# Patient Record
Sex: Female | Born: 1970 | Race: White | Hispanic: No | Marital: Married | Smoking: Never smoker
Health system: Southern US, Community
[De-identification: ages and names within clinical notes are randomized; demographics above are authoritative.]

## PROBLEM LIST (undated history)

## (undated) DIAGNOSIS — J45909 Unspecified asthma, uncomplicated: Secondary | ICD-10-CM

## (undated) DIAGNOSIS — F419 Anxiety disorder, unspecified: Secondary | ICD-10-CM

## (undated) DIAGNOSIS — I10 Essential (primary) hypertension: Secondary | ICD-10-CM

## (undated) DIAGNOSIS — F32A Depression, unspecified: Secondary | ICD-10-CM

## (undated) DIAGNOSIS — F329 Major depressive disorder, single episode, unspecified: Secondary | ICD-10-CM

## (undated) DIAGNOSIS — K219 Gastro-esophageal reflux disease without esophagitis: Secondary | ICD-10-CM

## (undated) DIAGNOSIS — G4733 Obstructive sleep apnea (adult) (pediatric): Secondary | ICD-10-CM

## (undated) HISTORY — PX: WISDOM TOOTH EXTRACTION: SHX21

## (undated) HISTORY — PX: UPPER GI ENDOSCOPY: SHX6162

---

## 2010-01-25 ENCOUNTER — Encounter: Admission: RE | Admit: 2010-01-25 | Discharge: 2010-01-25 | Payer: Self-pay | Admitting: Obstetrics and Gynecology

## 2010-09-15 ENCOUNTER — Encounter: Admission: RE | Admit: 2010-09-15 | Discharge: 2010-09-15 | Payer: Self-pay | Admitting: Obstetrics and Gynecology

## 2011-08-20 ENCOUNTER — Emergency Department (HOSPITAL_COMMUNITY): Payer: BC Managed Care – PPO

## 2011-08-20 ENCOUNTER — Emergency Department (HOSPITAL_COMMUNITY)
Admission: EM | Admit: 2011-08-20 | Discharge: 2011-08-20 | Disposition: A | Discharge status: Home / Self Care | Payer: BC Managed Care – PPO | Attending: Emergency Medicine | Admitting: Emergency Medicine

## 2011-08-20 DIAGNOSIS — W010XXA Fall on same level from slipping, tripping and stumbling without subsequent striking against object, initial encounter: Secondary | ICD-10-CM | POA: Insufficient documentation

## 2011-08-20 DIAGNOSIS — S52599A Other fractures of lower end of unspecified radius, initial encounter for closed fracture: Secondary | ICD-10-CM | POA: Insufficient documentation

## 2011-08-20 DIAGNOSIS — Y92009 Unspecified place in unspecified non-institutional (private) residence as the place of occurrence of the external cause: Secondary | ICD-10-CM | POA: Insufficient documentation

## 2011-11-08 HISTORY — PX: BREAST BIOPSY: SHX20

## 2012-04-20 ENCOUNTER — Other Ambulatory Visit: Payer: Self-pay | Admitting: Obstetrics and Gynecology

## 2012-04-20 DIAGNOSIS — N63 Unspecified lump in unspecified breast: Secondary | ICD-10-CM

## 2012-04-30 ENCOUNTER — Ambulatory Visit
Admission: RE | Admit: 2012-04-30 | Discharge: 2012-04-30 | Disposition: A | Discharge status: Home / Self Care | Payer: BC Managed Care – PPO | Source: Ambulatory Visit | Attending: Obstetrics and Gynecology | Admitting: Obstetrics and Gynecology

## 2012-04-30 ENCOUNTER — Other Ambulatory Visit: Payer: Self-pay | Admitting: Obstetrics and Gynecology

## 2012-04-30 DIAGNOSIS — N63 Unspecified lump in unspecified breast: Secondary | ICD-10-CM

## 2012-05-03 ENCOUNTER — Ambulatory Visit
Admission: RE | Admit: 2012-05-03 | Discharge: 2012-05-03 | Disposition: A | Discharge status: Home / Self Care | Payer: BC Managed Care – PPO | Source: Ambulatory Visit | Attending: Obstetrics and Gynecology | Admitting: Obstetrics and Gynecology

## 2012-05-03 ENCOUNTER — Other Ambulatory Visit: Payer: Self-pay | Admitting: Obstetrics and Gynecology

## 2012-05-03 DIAGNOSIS — N63 Unspecified lump in unspecified breast: Secondary | ICD-10-CM

## 2012-05-04 ENCOUNTER — Other Ambulatory Visit: Payer: BC Managed Care – PPO

## 2012-06-06 ENCOUNTER — Ambulatory Visit: Payer: BC Managed Care – PPO

## 2012-06-06 ENCOUNTER — Ambulatory Visit (INDEPENDENT_AMBULATORY_CARE_PROVIDER_SITE_OTHER): Payer: BC Managed Care – PPO | Admitting: Family Medicine

## 2012-06-06 VITALS — BP 108/72 | HR 53 | Temp 98.2°F | Resp 18 | Ht 68.0 in | Wt 221.2 lb

## 2012-06-06 DIAGNOSIS — R079 Chest pain, unspecified: Secondary | ICD-10-CM

## 2012-06-06 NOTE — Progress Notes (Signed)
[facility] [address] [place] [address] [phone]  Date:  06/06/2012   Name:  Sylvia Watkins   DOB:  07-18-1971   MRN:  [id number]  PCP:  No primary provider on file.    Chief Complaint: Neck Pain, Chest Pain and Stress   History of Present Illness:  Sylvia Watkins is a 41 y.o. very pleasant female patient who presents with the following:  She has noted CP in her left chest/ under the left breast intermittently over the last several months- she has had this problem on occasion for several years. When her weight fluctuates up her symptoms seem to become more pronounced.  She has also noted an ache in her left neck when she is stressed. She can feel a prickly feeling in her chest, and a tightness in her chest when she gets upset or stressed.    She noted her symptoms yesterday in the afternoon/ evening, and again while in the waiting room.   No pain currently.     She passed out as a teen and had a negative cardiac evaluation- no cardiac evaluation since.    Sylvia Watkins does not exercise regularly, but when she does exercise she does not have CP.   Her father has WPW- no other cardiac history besides HTN in her mother.    She recently has a breast boipsy- in her left breast.  This did turn out to be benign.   She has never been a smoker. No history of HTN, no history of DM. She is trying to taper off cymbalta- has been tapering off this for the last month or so.    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    Aspirin causes stomach pain.    There is no problem list on file for this patient.   No past medical history on file.  No past surgical history on file.  History  Substance Use Topics  . Smoking status: Never Smoker   . Smokeless tobacco: Not on file  . Alcohol Use: Not on file    No family history on file.  No Known Allergies  Medication list has been reviewed and updated.  No current outpatient prescriptions on file  prior to visit.    Review of Systems:  As per HPI- otherwise negative.   Physical Examination: Filed Vitals:   06/06/12 1228  BP: 108/72  Pulse: 53  Temp: 98.2 F (36.8 C)  Resp: 18   Filed Vitals:   06/06/12 1228  Height: 5\' 8"  (1.727 m)  Weight: 221 lb 3.2 oz (100.336 kg)   Body mass index is 33.63 kg/(m^2). Ideal Body Weight: Weight in (lb) to have BMI = 25: 164.1   GEN: WDWN, NAD, Non-toxic, A & O x 3, overweight HEENT: Atraumatic, Normocephalic. Neck supple. No masses, No LAD.  PEERL, EOMI, oropharynx wnl.   Ears and Nose: No external deformity. CV: RRR, No M/G/R. No JVD. No thrill. No extra heart sounds.  Chest wall is benign, no redness or bruise, unable to reproduce her CP by pressing on her chest PULM: CTA B, no wheezes, crackles, rhonchi. No retractions. No resp. distress. No accessory muscle use. ABD: S, NT, ND, +BS. No rebound. No HSM. EXTR: No c/c/e NEURO Normal gait.  PSYCH: Normally interactive. Conversant. Tearful at times.   EKG:  NSR, no ST elevation or depression  [facility] reading (PRIMARY) by  Dr. Patsy Lager.  Normal CXR CHEST - 2 VIEW  Comparison: None.  Findings: The heart size is normal. The lungs are clear. The visualized soft tissues and bony thorax are unremarkable.  IMPRESSION: Negative chest.  Assessment and Plan: 1. Chest pain  EKG 12-Lead, DG Chest 2 View, Ambulatory referral to Cardiology   Sylvia Watkins is having atypical CP- this has gone on intermittently for years, but has been worse for the last couple of months. Is likely related to anxiety, and may be partially due to her current tapering of cymbalta. Let her know that I cannot completely rule- out CAD here in the office, and offered to arrange an ED evaluation.  However, did explain that her symptoms are not typical of CAD, and that her VS and EKG are reassuring.  She elected to proceed with an ou-tpt cardiology evaluation.  Suspect that they may order a stress EKG for further evaluation.   Sylvia Watkins will seek care right away if her symptoms worsen or change in the meantime.  She will not take asa as it causes stomach upset.    Abbe Amsterdam, MD

## 2012-07-29 ENCOUNTER — Encounter: Payer: Self-pay | Admitting: *Deleted

## 2012-07-29 DIAGNOSIS — R079 Chest pain, unspecified: Secondary | ICD-10-CM

## 2012-08-02 ENCOUNTER — Encounter: Payer: Self-pay | Admitting: Family Medicine

## 2012-08-02 ENCOUNTER — Encounter: Payer: Self-pay | Admitting: *Deleted

## 2012-08-02 DIAGNOSIS — R079 Chest pain, unspecified: Secondary | ICD-10-CM

## 2012-08-20 ENCOUNTER — Encounter (HOSPITAL_COMMUNITY): Payer: Self-pay | Admitting: *Deleted

## 2012-08-21 ENCOUNTER — Encounter (HOSPITAL_COMMUNITY)
Admission: RE | Disposition: A | Discharge status: Home / Self Care | Payer: Self-pay | Source: Ambulatory Visit | Attending: Cardiology

## 2012-08-21 ENCOUNTER — Encounter (HOSPITAL_COMMUNITY): Payer: Self-pay | Admitting: *Deleted

## 2012-08-21 ENCOUNTER — Ambulatory Visit (HOSPITAL_COMMUNITY)
Admission: RE | Admit: 2012-08-21 | Discharge: 2012-08-21 | Disposition: A | Discharge status: Home / Self Care | Payer: BC Managed Care – PPO | Source: Ambulatory Visit | Attending: Cardiology | Admitting: Cardiology

## 2012-08-21 DIAGNOSIS — R079 Chest pain, unspecified: Secondary | ICD-10-CM

## 2012-08-21 DIAGNOSIS — R072 Precordial pain: Secondary | ICD-10-CM | POA: Insufficient documentation

## 2012-08-21 HISTORY — DX: Gastro-esophageal reflux disease without esophagitis: K21.9

## 2012-08-21 HISTORY — DX: Depression, unspecified: F32.A

## 2012-08-21 HISTORY — PX: TEE WITHOUT CARDIOVERSION: SHX5443

## 2012-08-21 HISTORY — DX: Anxiety disorder, unspecified: F41.9

## 2012-08-21 HISTORY — DX: Major depressive disorder, single episode, unspecified: F32.9

## 2012-08-21 HISTORY — DX: Unspecified asthma, uncomplicated: J45.909

## 2012-08-21 SURGERY — ECHOCARDIOGRAM, TRANSESOPHAGEAL
Anesthesia: Moderate Sedation

## 2012-08-21 MED ORDER — BUTAMBEN-TETRACAINE-BENZOCAINE 2-2-14 % EX AERO
INHALATION_SPRAY | CUTANEOUS | Status: DC | PRN
  Administered 2012-08-21: 2 via TOPICAL

## 2012-08-21 MED ORDER — FENTANYL CITRATE 0.05 MG/ML IJ SOLN
INTRAMUSCULAR | Status: DC | PRN
  Administered 2012-08-21 (×2): 25 ug via INTRAVENOUS

## 2012-08-21 MED ORDER — FENTANYL CITRATE 0.05 MG/ML IJ SOLN
INTRAMUSCULAR | Status: AC
  Filled 2012-08-21: qty 2

## 2012-08-21 MED ORDER — MIDAZOLAM HCL 10 MG/2ML IJ SOLN
INTRAMUSCULAR | Status: DC | PRN
  Administered 2012-08-21 (×2): 2 mg via INTRAVENOUS

## 2012-08-21 MED ORDER — SODIUM CHLORIDE 0.9 % IV SOLN
INTRAVENOUS | Status: DC
  Administered 2012-08-21: 500 mL via INTRAVENOUS

## 2012-08-21 MED ORDER — MIDAZOLAM HCL 5 MG/ML IJ SOLN
INTRAMUSCULAR | Status: AC
  Filled 2012-08-21: qty 2

## 2012-08-21 NOTE — Interval H&P Note (Signed)
History and Physical Interval Note:  08/21/2012 11:18 AM  Sylvia Watkins  has presented today for surgery, with the diagnosis of inter cardiac structure  The various methods of treatment have been discussed with the patient and family. After consideration of risks, benefits and other options for treatment, the patient has consented to  Procedure(s) (LRB) with comments: TRANSESOPHAGEAL ECHOCARDIOGRAM (TEE) (N/A) as a surgical intervention .  The patient's history has been reviewed, patient examined, no change in status, stable for surgery.  I have reviewed the patient's chart and labs.  Questions were answered to the patient's satisfaction.     Pamella Pert

## 2012-08-21 NOTE — H&P (Signed)
Sylvia Watkins is an 41 y.o. female.   Chief Complaint:  Chest pain HPI: Initially presented to  Me for evaluation of chest pain. OP echocardiogram done last month revealed a highly mobile mass in the LVOT and attached to the MV chordae. Now brought in for TEE to better evaluate the structure.  Past Medical History  Diagnosis Date  . Anxiety   . Depression   . GERD (gastroesophageal reflux disease)   . Asthma     History reviewed. No pertinent past surgical history.  History reviewed. No pertinent family history. Social History:  reports that she has never smoked. She has never used smokeless tobacco. She reports that she does not use illicit drugs. Her alcohol history not on file.  Allergies: No Known Allergies  Medications Prior to Admission  Medication Sig Dispense Refill  . esomeprazole (NEXIUM) 40 MG capsule Take 40 mg by mouth daily before breakfast.      . fish oil-omega-3 fatty acids 1000 MG capsule Take 2 g by mouth daily.      Marland Kitchen loratadine (CLARITIN) 10 MG tablet Take 10 mg by mouth daily.        No results found for this or any previous visit (from the past 48 hour(s)). No results found. ROS: No chest pain, dyspnea, headache, visual disturbances. Others are negative  Blood pressure 125/76, temperature 98.3 F (36.8 C), temperature source Oral, resp. rate 11, height 5\' 8"  (1.727 m), weight 95.709 kg (211 lb), last menstrual period 08/07/2012, SpO2 100.00%. General appearance: alert, cooperative and appears stated age Eyes: conjunctivae/corneas clear. PERRL, EOM's intact. Fundi benign. Neck: no adenopathy, no carotid bruit, no JVD, supple, symmetrical, trachea midline and thyroid not enlarged, symmetric, no tenderness/mass/nodules Neck: JVP - normal, carotids 2+= without bruits Resp: clear to auscultation bilaterally Chest wall: no tenderness Cardio: regular rate and rhythm, S1, S2 normal, no murmur, click, rub or gallop GI: soft, non-tender; bowel sounds normal; no  masses,  no organomegaly Extremities: extremities normal, atraumatic, no cyanosis or edema Pulses: 2+ and symmetric Skin: Skin color, texture, turgor normal. No rashes or lesions Neurologic: Alert and oriented X 3, normal strength and tone. Normal symmetric reflexes. Normal coordination and gait   Assessment/Plan 1. Abnormal echocardiogram now scheduled for TEE.  Pamella Pert, MD 08/21/2012, 11:14 AM

## 2012-08-21 NOTE — Progress Notes (Signed)
  Echocardiogram Echocardiogram Transesophageal has been performed.  Sylvia Watkins 08/21/2012, 3:35 PM

## 2012-08-21 NOTE — CV Procedure (Signed)
TEE: Under moderate sedation, TEE was performed without complications: LV: Normal. Normal EF. RV: Normal LA: Normal. Left atrial appendage: Normal without thrombus. Normal function. Inter atrial septum is intact without defect. Double contrast study negative for atrial level shunting. No late appearance of bubbles either. RA: Normal  MV: Normal Trace MR. There was a redundant structure noted attached to the posterior papillary muscle and protrudes into the LVOT during systole. No obstruction to aortic outflow. Probably redundant chordae or extra chordae as It appears to be close to posterior papillary muscle, but probably coming free from the LV posterior wall and disconnected from the MV. No MVP or MR. TV: Normal Trace TR AV: Normal. No AI or AS. PV: Normal. Trace PI.  Thoracic and ascending aorta: Normal without significant plaque or atheromatous changes.

## 2013-04-24 ENCOUNTER — Other Ambulatory Visit: Payer: Self-pay | Admitting: Obstetrics and Gynecology

## 2013-06-18 ENCOUNTER — Other Ambulatory Visit: Payer: Self-pay

## 2013-06-18 DIAGNOSIS — Z1231 Encounter for screening mammogram for malignant neoplasm of breast: Secondary | ICD-10-CM

## 2013-07-10 ENCOUNTER — Ambulatory Visit
Admission: RE | Admit: 2013-07-10 | Discharge: 2013-07-10 | Disposition: A | Discharge status: Home / Self Care | Payer: BC Managed Care – PPO | Source: Ambulatory Visit

## 2013-07-10 DIAGNOSIS — Z1231 Encounter for screening mammogram for malignant neoplasm of breast: Secondary | ICD-10-CM

## 2014-03-06 ENCOUNTER — Encounter (HOSPITAL_BASED_OUTPATIENT_CLINIC_OR_DEPARTMENT_OTHER): Payer: Self-pay | Admitting: *Deleted

## 2014-03-06 ENCOUNTER — Other Ambulatory Visit: Payer: Self-pay | Admitting: Otolaryngology

## 2014-03-06 NOTE — Progress Notes (Signed)
No labs needed

## 2014-03-10 ENCOUNTER — Ambulatory Visit (HOSPITAL_BASED_OUTPATIENT_CLINIC_OR_DEPARTMENT_OTHER): Payer: BC Managed Care – PPO | Admitting: Anesthesiology

## 2014-03-10 ENCOUNTER — Ambulatory Visit (HOSPITAL_BASED_OUTPATIENT_CLINIC_OR_DEPARTMENT_OTHER)
Admission: RE | Admit: 2014-03-10 | Discharge: 2014-03-10 | Disposition: A | Discharge status: Home / Self Care | Payer: BC Managed Care – PPO | Source: Ambulatory Visit | Attending: Otolaryngology | Admitting: Otolaryngology

## 2014-03-10 ENCOUNTER — Encounter (HOSPITAL_BASED_OUTPATIENT_CLINIC_OR_DEPARTMENT_OTHER): Payer: BC Managed Care – PPO | Admitting: Anesthesiology

## 2014-03-10 ENCOUNTER — Encounter (HOSPITAL_BASED_OUTPATIENT_CLINIC_OR_DEPARTMENT_OTHER)
Admission: RE | Disposition: A | Discharge status: Home / Self Care | Payer: Self-pay | Source: Ambulatory Visit | Attending: Otolaryngology

## 2014-03-10 ENCOUNTER — Encounter (HOSPITAL_BASED_OUTPATIENT_CLINIC_OR_DEPARTMENT_OTHER): Payer: Self-pay | Admitting: *Deleted

## 2014-03-10 DIAGNOSIS — K219 Gastro-esophageal reflux disease without esophagitis: Secondary | ICD-10-CM | POA: Insufficient documentation

## 2014-03-10 DIAGNOSIS — F3289 Other specified depressive episodes: Secondary | ICD-10-CM | POA: Insufficient documentation

## 2014-03-10 DIAGNOSIS — J45909 Unspecified asthma, uncomplicated: Secondary | ICD-10-CM | POA: Insufficient documentation

## 2014-03-10 DIAGNOSIS — F411 Generalized anxiety disorder: Secondary | ICD-10-CM | POA: Insufficient documentation

## 2014-03-10 DIAGNOSIS — J029 Acute pharyngitis, unspecified: Secondary | ICD-10-CM | POA: Insufficient documentation

## 2014-03-10 DIAGNOSIS — Z9089 Acquired absence of other organs: Secondary | ICD-10-CM

## 2014-03-10 DIAGNOSIS — J3501 Chronic tonsillitis: Secondary | ICD-10-CM | POA: Insufficient documentation

## 2014-03-10 DIAGNOSIS — F329 Major depressive disorder, single episode, unspecified: Secondary | ICD-10-CM | POA: Insufficient documentation

## 2014-03-10 HISTORY — PX: TONSILLECTOMY: SHX5217

## 2014-03-10 LAB — POCT HEMOGLOBIN-HEMACUE: Hemoglobin: 12.9 g/dL (ref 12.0–15.0)

## 2014-03-10 SURGERY — TONSILLECTOMY
Anesthesia: General | Site: Mouth

## 2014-03-10 MED ORDER — DEXAMETHASONE SODIUM PHOSPHATE 4 MG/ML IJ SOLN
INTRAMUSCULAR | Status: DC | PRN
  Administered 2014-03-10: 10 mg via INTRAVENOUS

## 2014-03-10 MED ORDER — OXYCODONE HCL 5 MG/5ML PO SOLN
5.0000 mg | Freq: Once | ORAL | Status: AC | PRN
  Administered 2014-03-10: 5 mg via ORAL

## 2014-03-10 MED ORDER — PROPOFOL 10 MG/ML IV BOLUS
INTRAVENOUS | Status: DC | PRN
  Administered 2014-03-10: 200 mg via INTRAVENOUS

## 2014-03-10 MED ORDER — LACTATED RINGERS IV SOLN
INTRAVENOUS | Status: DC
  Administered 2014-03-10: 09:00:00 via INTRAVENOUS

## 2014-03-10 MED ORDER — OXYCODONE HCL 5 MG PO TABS: 5.0000 mg | ORAL_TABLET | Freq: Once | ORAL | Status: AC | PRN

## 2014-03-10 MED ORDER — MIDAZOLAM HCL 2 MG/2ML IJ SOLN
INTRAMUSCULAR | Status: AC
Start: 2014-03-10 — End: 2014-03-10
  Filled 2014-03-10: qty 2

## 2014-03-10 MED ORDER — FENTANYL CITRATE 0.05 MG/ML IJ SOLN
INTRAMUSCULAR | Status: DC | PRN
  Administered 2014-03-10: 100 ug via INTRAVENOUS
  Administered 2014-03-10: 50 ug via INTRAVENOUS

## 2014-03-10 MED ORDER — SODIUM CHLORIDE 0.9 % IR SOLN
Status: DC | PRN
  Administered 2014-03-10: 200 mL

## 2014-03-10 MED ORDER — ONDANSETRON HCL 4 MG/2ML IJ SOLN
INTRAMUSCULAR | Status: DC | PRN
  Administered 2014-03-10: 4 mg via INTRAVENOUS

## 2014-03-10 MED ORDER — OXYMETAZOLINE HCL 0.05 % NA SOLN
NASAL | Status: DC | PRN
  Administered 2014-03-10: 1

## 2014-03-10 MED ORDER — MIDAZOLAM HCL 5 MG/5ML IJ SOLN
INTRAMUSCULAR | Status: DC | PRN
  Administered 2014-03-10: 2 mg via INTRAVENOUS

## 2014-03-10 MED ORDER — FENTANYL CITRATE 0.05 MG/ML IJ SOLN: 50.0000 ug | INTRAMUSCULAR | Status: DC | PRN

## 2014-03-10 MED ORDER — LIDOCAINE HCL (CARDIAC) 20 MG/ML IV SOLN
INTRAVENOUS | Status: DC | PRN
  Administered 2014-03-10: 100 mg via INTRAVENOUS

## 2014-03-10 MED ORDER — BACITRACIN 500 UNIT/GM EX OINT
TOPICAL_OINTMENT | CUTANEOUS | Status: DC | PRN
  Administered 2014-03-10: 1 via TOPICAL

## 2014-03-10 MED ORDER — MIDAZOLAM HCL 2 MG/2ML IJ SOLN: 1.0000 mg | INTRAMUSCULAR | Status: DC | PRN

## 2014-03-10 MED ORDER — OXYCODONE HCL 5 MG/5ML PO SOLN
ORAL | Status: AC
  Filled 2014-03-10: qty 5

## 2014-03-10 MED ORDER — OXYCODONE HCL 5 MG/5ML PO SOLN: 5.0000 mg | ORAL | Status: DC | PRN

## 2014-03-10 MED ORDER — ONDANSETRON HCL 4 MG/2ML IJ SOLN: 4.0000 mg | Freq: Four times a day (QID) | INTRAMUSCULAR | Status: DC | PRN

## 2014-03-10 MED ORDER — AMOXICILLIN 400 MG/5ML PO SUSR: 800.0000 mg | Freq: Two times a day (BID) | ORAL | Status: AC

## 2014-03-10 MED ORDER — SUCCINYLCHOLINE CHLORIDE 20 MG/ML IJ SOLN
INTRAMUSCULAR | Status: DC | PRN
  Administered 2014-03-10: 100 mg via INTRAVENOUS

## 2014-03-10 MED ORDER — FENTANYL CITRATE 0.05 MG/ML IJ SOLN
INTRAMUSCULAR | Status: AC
  Filled 2014-03-10: qty 6

## 2014-03-10 MED ORDER — HYDROMORPHONE HCL PF 1 MG/ML IJ SOLN: 0.2500 mg | INTRAMUSCULAR | Status: DC | PRN

## 2014-03-10 SURGICAL SUPPLY — 28 items
BANDAGE COBAN STERILE 2 (GAUZE/BANDAGES/DRESSINGS) IMPLANT
CANISTER SUCT 1200ML W/VALVE (MISCELLANEOUS) ×2 IMPLANT
CATH ROBINSON RED A/P 10FR (CATHETERS) IMPLANT
CATH ROBINSON RED A/P 14FR (CATHETERS) ×2 IMPLANT
COAGULATOR SUCT SWTCH 10FR 6 (ELECTROSURGICAL) ×2 IMPLANT
COVER MAYO STAND STRL (DRAPES) ×2 IMPLANT
ELECT REM PT RETURN 9FT ADLT (ELECTROSURGICAL) ×2
ELECT REM PT RETURN 9FT PED (ELECTROSURGICAL)
ELECTRODE REM PT RETRN 9FT PED (ELECTROSURGICAL) IMPLANT
ELECTRODE REM PT RTRN 9FT ADLT (ELECTROSURGICAL) ×1 IMPLANT
GLOVE BIO SURGEON STRL SZ7.5 (GLOVE) ×2 IMPLANT
GOWN STRL REUS W/ TWL LRG LVL3 (GOWN DISPOSABLE) ×2 IMPLANT
GOWN STRL REUS W/TWL LRG LVL3 (GOWN DISPOSABLE) ×2
IV NS 500ML (IV SOLUTION) ×1
IV NS 500ML BAXH (IV SOLUTION) ×1 IMPLANT
MARKER SKIN DUAL TIP RULER LAB (MISCELLANEOUS) IMPLANT
NS IRRIG 1000ML POUR BTL (IV SOLUTION) IMPLANT
SHEET MEDIUM DRAPE 40X70 STRL (DRAPES) ×2 IMPLANT
SOLUTION BUTLER CLEAR DIP (MISCELLANEOUS) ×2 IMPLANT
SPONGE GAUZE 4X4 12PLY STER LF (GAUZE/BANDAGES/DRESSINGS) ×2 IMPLANT
SPONGE TONSIL 1 RF SGL (DISPOSABLE) IMPLANT
SPONGE TONSIL 1.25 RF SGL STRG (GAUZE/BANDAGES/DRESSINGS) ×2 IMPLANT
SYR BULB 3OZ (MISCELLANEOUS) IMPLANT
TOWEL OR 17X24 6PK STRL BLUE (TOWEL DISPOSABLE) ×2 IMPLANT
TUBE CONNECTING 20X1/4 (TUBING) ×2 IMPLANT
TUBE SALEM SUMP 12R W/ARV (TUBING) IMPLANT
TUBE SALEM SUMP 16 FR W/ARV (TUBING) ×2 IMPLANT
WAND COBLATOR 70 EVAC XTRA (SURGICAL WAND) ×2 IMPLANT

## 2014-03-10 NOTE — H&P (Signed)
  H&P Update  Pt's original H&P dated 03/04/14 reviewed and placed in chart (to be scanned).  I personally examined the patient today.  No change in health. Proceed with tonsillectomy.

## 2014-03-10 NOTE — Transfer of Care (Signed)
Immediate Anesthesia Transfer of Care Note  Patient: Sylvia SavoyShannon C Watkins  Procedure(s) Performed: Procedure(s): TONSILLECTOMY (N/A)  Patient Location: PACU  Anesthesia Type:General  Level of Consciousness: awake and alert   Airway & Oxygen Therapy: Patient Spontanous Breathing and Patient connected to face mask oxygen  Post-op Assessment: Report given to PACU RN and Post -op Vital signs reviewed and stable  Post vital signs: Reviewed and stable  Complications: No apparent anesthesia complications

## 2014-03-10 NOTE — Op Note (Signed)
DATE OF PROCEDURE:  03/10/2014                              OPERATIVE REPORT  SURGEON:  Newman PiesSu Selby Slovacek, MD  PREOPERATIVE DIAGNOSES: 1. Tonsillar hypertrophy. 2. Chronic tonsillitis and pharyngitis  POSTOPERATIVE DIAGNOSES: 1. Tonsillar hypertrophy. 2. Chronic tonsillitis and pharyngitis  PROCEDURE PERFORMED:  Tonsillectomy.  ANESTHESIA:  General endotracheal tube anesthesia.  COMPLICATIONS:  None.  ESTIMATED BLOOD LOSS:  Minimal.  INDICATION FOR PROCEDURE:  Sylvia Watkins is a 43 y.o. female with a history of chronic tonsillitis/pharyngitis and halitosis.  According to the patient, she has been experiencing chronic throat discomfort with halitosis for several years. The patient continues to be symptomatic despite medical treatments. On examination, the patient was noted to have bilateral cryptic tonsils, with numerous tonsilloliths. Based on the above findings, the decision was made for the patient to undergo the tonsillectomy procedure. Likelihood of success in reducing symptoms was also discussed.  The risks, benefits, alternatives, and details of the procedure were discussed with the mother.  Questions were invited and answered.  Informed consent was obtained.  DESCRIPTION:  The patient was taken to the operating room and placed supine on the operating table.  General endotracheal tube anesthesia was administered by the anesthesiologist.  The patient was positioned and prepped and draped in a standard fashion for adenotonsillectomy.  A Crowe-Davis mouth gag was inserted into the oral cavity for exposure. 2+ cryptic tonsils were noted bilaterally.  No bifidity was noted.  Indirect mirror examination of the nasopharynx revealed no significant adenoid hypertrophy.  The right tonsil was then grasped with a straight Allis clamp and retracted medially.  It was resected free from the underlying pharyngeal constrictor muscles with the Coblator device.  The same procedure was repeated on the left side  without exception.  The surgical sites were copiously irrigated.  The mouth gag was removed.  The care of the patient was turned over to the anesthesiologist.  The patient was awakened from anesthesia without difficulty.  The patient was extubated and transferred to the recovery room in good condition.  OPERATIVE FINDINGS:  Tonsillar hypertrophy.  SPECIMEN:  Bilateral tonsils  FOLLOWUP CARE:  The patient will be discharged home once awake and alert.  She will be placed on amoxicillin 800 mg p.o. b.i.d. for 5 days, and oxycodone 5-5210ml po q 4 hours for postop pain control.   The patient will follow up in my office in approximately 2 weeks.  Darletta MollSui W Leticia Coletta 03/10/2014 10:28 AM

## 2014-03-10 NOTE — Discharge Instructions (Addendum)
[facility] °Postoperative Instructions for Tonsillectomy & Adenoidectomy (T&A) °Activity °Restrict activity at home for the first two days, resting as much as possible. Light indoor activity is best. You may usually return to school or work within a week but void strenuous activity and sports for two weeks. Sleep with your head elevated on 2-3 pillows for 3-4 days to help decrease swelling. °Diet °Due to tissue swelling and throat discomfort, you may have little desire to drink for several days. However fluids are very important to prevent dehydration. You will find that non-acidic juices, soups, popsicles, Jell-O, custard, puddings, and any soft or mashed foods taken in small quantities can be swallowed fairly easily. Try to increase your fluid and food intake as the discomfort subsides. It is recommended that a child receive 1-1/2 quarts of fluid in a 24-hour period. Adult require twice this amount.  °Discomfort °Your sore throat may be relieved by applying an ice collar to your neck and/or by taking Tylenol®. You may experience an earache, which is due to referred pain from the throat. Referred ear pain is commonly felt at night when trying to rest. ° °Bleeding                        Although rare, there is risk of having some bleeding during the first 2 weeks after having a T&A. This usually happens between days 7-10 postoperatively. If you or your child should have any bleeding, try to remain calm. We recommend sitting up quietly in a chair and gently spitting out the blood into a bowl. For adults, gargling gently with ice water may help. If the bleeding does not stop after a short time (5 minutes), is more than 1 teaspoonful, or if you become worried, please call our office at [phone] or go directly to the nearest hospital emergency room. Do not eat or drink anything prior to going to the hospital as you may need to be taken to the operating room in order to control the bleeding. °GENERAL  CONSIDERATIONS °1. Brush your teeth regularly. Avoid mouthwashes and gargles for three weeks. You may gargle gently with warm salt-water as necessary or spray with Chloraseptic®. You may make salt-water by placing 2 teaspoons of table salt into a quart of fresh water. Warm the salt-water in a microwave to a luke warm temperature.  °2. Avoid exposure to colds and upper respiratory infections if possible.  °3. If you look into a mirror or into your child's mouth, you will see white-gray patches in the back of the throat. This is normal after having a T&A and is like a scab that forms on the skin after an abrasion. It will disappear once the back of the throat heals completely. However, it may cause a noticeable odor; this too will disappear with time. Again, warm salt-water gargles may be used to help keep the throat clean and promote healing.  °4. You may notice a temporary change in voice quality, such as a higher pitched voice or a nasal sound, until healing is complete. This may last for 1-2 weeks and should resolve.  °5. Do not take or give you child any medications that we have not prescribed or recommended.  °6. Snoring may occur, especially at night, for the first week after a T&A. It is due to swelling of the soft palate and will usually resolve.  °Please call our office at [phone] if you have any questions.   ° ° ° °  Post Anesthesia Home Care Instructions ° °Activity: °Get plenty of rest for the remainder of the day. A responsible adult should stay with you for 24 hours following the procedure.  °For the next 24 hours, DO NOT: °-Drive a car °-Operate machinery °-Drink alcoholic beverages °-Take any medication unless instructed by your physician °-Make any legal decisions or sign important papers. ° °Meals: °Start with liquid foods such as gelatin or soup. Progress to regular foods as tolerated. Avoid greasy, spicy, heavy foods. If nausea and/or vomiting occur, drink only clear liquids until the nausea  and/or vomiting subsides. Call your physician if vomiting continues. ° °Special Instructions/Symptoms: °Your throat may feel dry or sore from the anesthesia or the breathing tube placed in your throat during surgery. If this causes discomfort, gargle with warm salt water. The discomfort should disappear within 24 hours. ° °

## 2014-03-10 NOTE — Anesthesia Postprocedure Evaluation (Signed)
Anesthesia Post Note  Patient: Sylvia SavoyShannon C Watkins  Procedure(s) Performed: Procedure(s) (LRB): TONSILLECTOMY (N/A)  Anesthesia type: General  Patient location: PACU  Post pain: Pain level controlled and Adequate analgesia  Post assessment: Post-op Vital signs reviewed, Patient's Cardiovascular Status Stable, Respiratory Function Stable, Patent Airway and Pain level controlled  Last Vitals:  Filed Vitals:   03/10/14 1200  BP: 125/69  Pulse: 69  Temp: 36.5 C  Resp: 16    Post vital signs: Reviewed and stable  Level of consciousness: awake, alert  and oriented  Complications: No apparent anesthesia complications

## 2014-03-10 NOTE — Anesthesia Preprocedure Evaluation (Signed)
Anesthesia Evaluation  Patient identified by MRN, date of birth, ID band Patient awake    Reviewed: Allergy & Precautions, H&P , NPO status , Patient's Chart, lab work & pertinent test results  Airway Mallampati: II  Neck ROM: full    Dental   Pulmonary asthma ,          Cardiovascular negative cardio ROS      Neuro/Psych Anxiety Depression    GI/Hepatic GERD-  ,  Endo/Other    Renal/GU      Musculoskeletal   Abdominal   Peds  Hematology   Anesthesia Other Findings   Reproductive/Obstetrics                           Anesthesia Physical Anesthesia Plan  ASA: II  Anesthesia Plan: General   Post-op Pain Management:    Induction: Intravenous  Airway Management Planned: Oral ETT  Additional Equipment:   Intra-op Plan:   Post-operative Plan: Extubation in OR  Informed Consent: I have reviewed the patients History and Physical, chart, labs and discussed the procedure including the risks, benefits and alternatives for the proposed anesthesia with the patient or authorized representative who has indicated his/her understanding and acceptance.     Plan Discussed with: CRNA, Anesthesiologist and Surgeon  Anesthesia Plan Comments:         Anesthesia Quick Evaluation

## 2014-03-10 NOTE — Anesthesia Procedure Notes (Signed)
Procedure Name: Intubation Date/Time: 03/10/2014 9:51 AM Performed by: Caren MacadamARTER, Hajime Asfaw W Pre-anesthesia Checklist: Patient identified, Emergency Drugs available, Suction available and Patient being monitored Patient Re-evaluated:Patient Re-evaluated prior to inductionOxygen Delivery Method: Circle System Utilized Preoxygenation: Pre-oxygenation with 100% oxygen Intubation Type: IV induction Ventilation: Mask ventilation without difficulty Laryngoscope Size: Miller and 2 Grade View: Grade I Tube type: Oral Tube size: 7.0 mm Number of attempts: 1 Airway Equipment and Method: stylet and oral airway Placement Confirmation: ETT inserted through vocal cords under direct vision,  positive ETCO2 and breath sounds checked- equal and bilateral Secured at: 23 cm Tube secured with: Tape Dental Injury: Teeth and Oropharynx as per pre-operative assessment

## 2014-03-12 ENCOUNTER — Encounter (HOSPITAL_BASED_OUTPATIENT_CLINIC_OR_DEPARTMENT_OTHER): Payer: Self-pay | Admitting: Otolaryngology

## 2014-04-25 ENCOUNTER — Other Ambulatory Visit: Payer: Self-pay | Admitting: Obstetrics and Gynecology

## 2014-04-28 LAB — CYTOLOGY - PAP

## 2014-06-30 ENCOUNTER — Other Ambulatory Visit: Payer: Self-pay

## 2014-06-30 DIAGNOSIS — Z1231 Encounter for screening mammogram for malignant neoplasm of breast: Secondary | ICD-10-CM

## 2014-07-15 ENCOUNTER — Ambulatory Visit
Admission: RE | Admit: 2014-07-15 | Discharge: 2014-07-15 | Disposition: A | Discharge status: Home / Self Care | Payer: BC Managed Care – PPO | Source: Ambulatory Visit

## 2014-07-15 DIAGNOSIS — Z1231 Encounter for screening mammogram for malignant neoplasm of breast: Secondary | ICD-10-CM

## 2015-05-01 ENCOUNTER — Other Ambulatory Visit: Payer: Self-pay | Admitting: Obstetrics and Gynecology

## 2015-05-04 LAB — CYTOLOGY - PAP

## 2015-08-31 ENCOUNTER — Other Ambulatory Visit: Payer: Self-pay

## 2015-08-31 DIAGNOSIS — Z1231 Encounter for screening mammogram for malignant neoplasm of breast: Secondary | ICD-10-CM

## 2015-10-05 ENCOUNTER — Ambulatory Visit
Admission: RE | Admit: 2015-10-05 | Discharge: 2015-10-05 | Disposition: A | Discharge status: Home / Self Care | Payer: BLUE CROSS/BLUE SHIELD | Source: Ambulatory Visit

## 2015-10-05 DIAGNOSIS — Z1231 Encounter for screening mammogram for malignant neoplasm of breast: Secondary | ICD-10-CM

## 2016-06-27 ENCOUNTER — Other Ambulatory Visit: Payer: Self-pay | Admitting: Obstetrics and Gynecology

## 2016-06-29 LAB — CYTOLOGY - PAP

## 2017-11-28 DIAGNOSIS — F39 Unspecified mood [affective] disorder: Secondary | ICD-10-CM | POA: Diagnosis not present

## 2017-12-26 DIAGNOSIS — J301 Allergic rhinitis due to pollen: Secondary | ICD-10-CM | POA: Diagnosis not present

## 2017-12-26 DIAGNOSIS — K219 Gastro-esophageal reflux disease without esophagitis: Secondary | ICD-10-CM | POA: Diagnosis not present

## 2017-12-26 DIAGNOSIS — F319 Bipolar disorder, unspecified: Secondary | ICD-10-CM | POA: Diagnosis not present

## 2018-03-16 DIAGNOSIS — K219 Gastro-esophageal reflux disease without esophagitis: Secondary | ICD-10-CM | POA: Diagnosis not present

## 2018-03-28 DIAGNOSIS — F419 Anxiety disorder, unspecified: Secondary | ICD-10-CM | POA: Diagnosis not present

## 2018-03-28 DIAGNOSIS — F3341 Major depressive disorder, recurrent, in partial remission: Secondary | ICD-10-CM | POA: Diagnosis not present

## 2018-03-30 ENCOUNTER — Other Ambulatory Visit: Payer: Self-pay | Admitting: Obstetrics and Gynecology

## 2018-03-30 DIAGNOSIS — H16042 Marginal corneal ulcer, left eye: Secondary | ICD-10-CM | POA: Diagnosis not present

## 2018-03-30 DIAGNOSIS — Z1231 Encounter for screening mammogram for malignant neoplasm of breast: Secondary | ICD-10-CM

## 2018-04-01 DIAGNOSIS — H16042 Marginal corneal ulcer, left eye: Secondary | ICD-10-CM | POA: Diagnosis not present

## 2018-04-03 DIAGNOSIS — H16042 Marginal corneal ulcer, left eye: Secondary | ICD-10-CM | POA: Diagnosis not present

## 2018-04-10 DIAGNOSIS — H16042 Marginal corneal ulcer, left eye: Secondary | ICD-10-CM | POA: Diagnosis not present

## 2018-04-30 ENCOUNTER — Ambulatory Visit: Payer: BLUE CROSS/BLUE SHIELD

## 2018-05-17 ENCOUNTER — Encounter: Payer: Self-pay | Admitting: Radiology

## 2018-05-17 ENCOUNTER — Ambulatory Visit
Admission: RE | Admit: 2018-05-17 | Discharge: 2018-05-17 | Disposition: A | Discharge status: Home / Self Care | Payer: BLUE CROSS/BLUE SHIELD | Source: Ambulatory Visit | Attending: Obstetrics and Gynecology | Admitting: Obstetrics and Gynecology

## 2018-05-17 DIAGNOSIS — Z1231 Encounter for screening mammogram for malignant neoplasm of breast: Secondary | ICD-10-CM | POA: Diagnosis not present

## 2018-07-02 DIAGNOSIS — L309 Dermatitis, unspecified: Secondary | ICD-10-CM | POA: Diagnosis not present

## 2018-07-02 DIAGNOSIS — L719 Rosacea, unspecified: Secondary | ICD-10-CM | POA: Diagnosis not present

## 2018-08-10 DIAGNOSIS — Z124 Encounter for screening for malignant neoplasm of cervix: Secondary | ICD-10-CM | POA: Diagnosis not present

## 2018-08-10 DIAGNOSIS — Z01419 Encounter for gynecological examination (general) (routine) without abnormal findings: Secondary | ICD-10-CM | POA: Diagnosis not present

## 2018-08-10 DIAGNOSIS — Z6841 Body Mass Index (BMI) 40.0 and over, adult: Secondary | ICD-10-CM | POA: Diagnosis not present

## 2018-08-15 DIAGNOSIS — J069 Acute upper respiratory infection, unspecified: Secondary | ICD-10-CM | POA: Diagnosis not present

## 2018-08-28 DIAGNOSIS — J020 Streptococcal pharyngitis: Secondary | ICD-10-CM | POA: Diagnosis not present

## 2018-09-10 IMAGING — MG DIGITAL SCREENING BILATERAL MAMMOGRAM WITH TOMO AND CAD
8 series · 8 of 24 positions shown · non-contrast
Comparison: Previous exam(s).

CLINICAL DATA: Screening.

EXAM:
DIGITAL SCREENING BILATERAL MAMMOGRAM WITH TOMO AND CAD

[R MLO synth-2D]
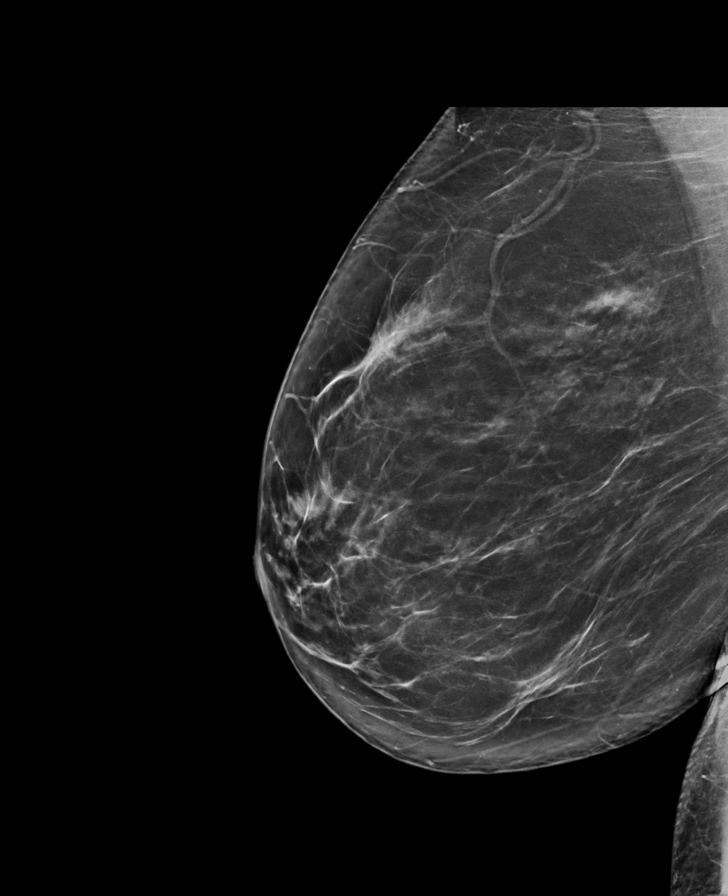

[L CC synth-2D]
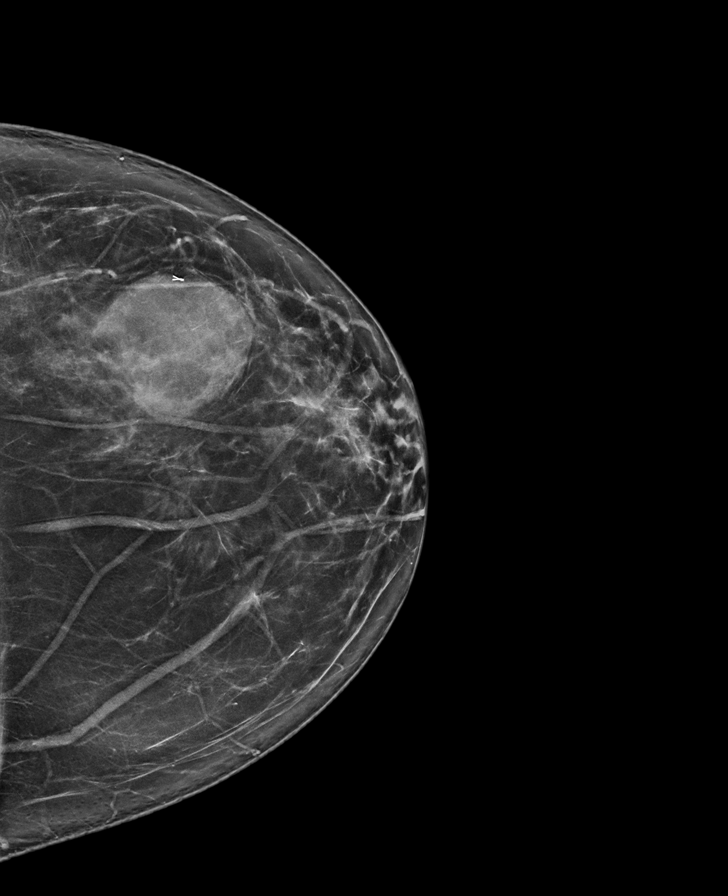

[R CC synth-2D]
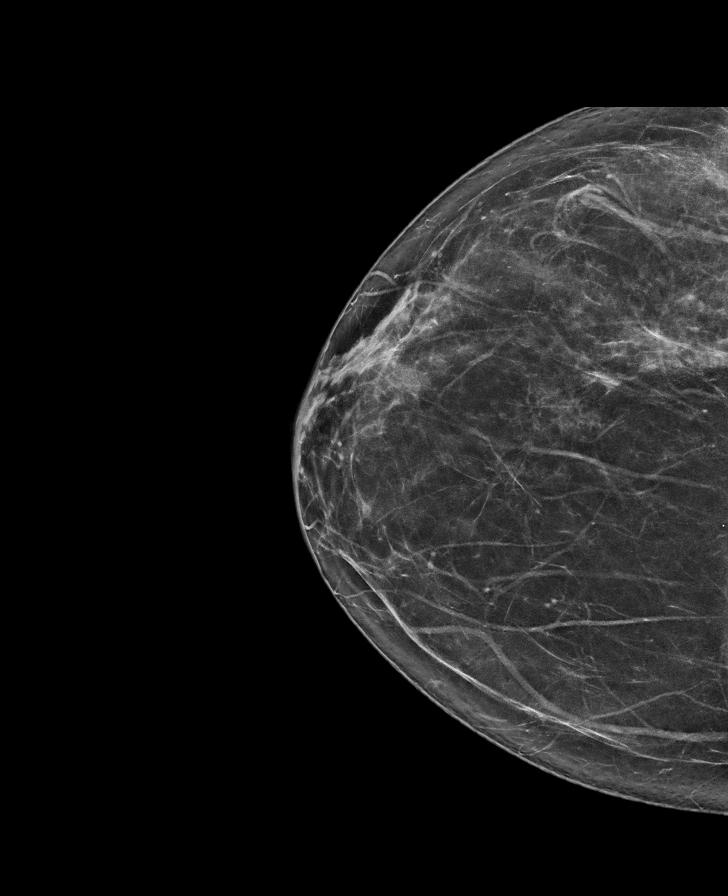

[L MLO synth-2D]
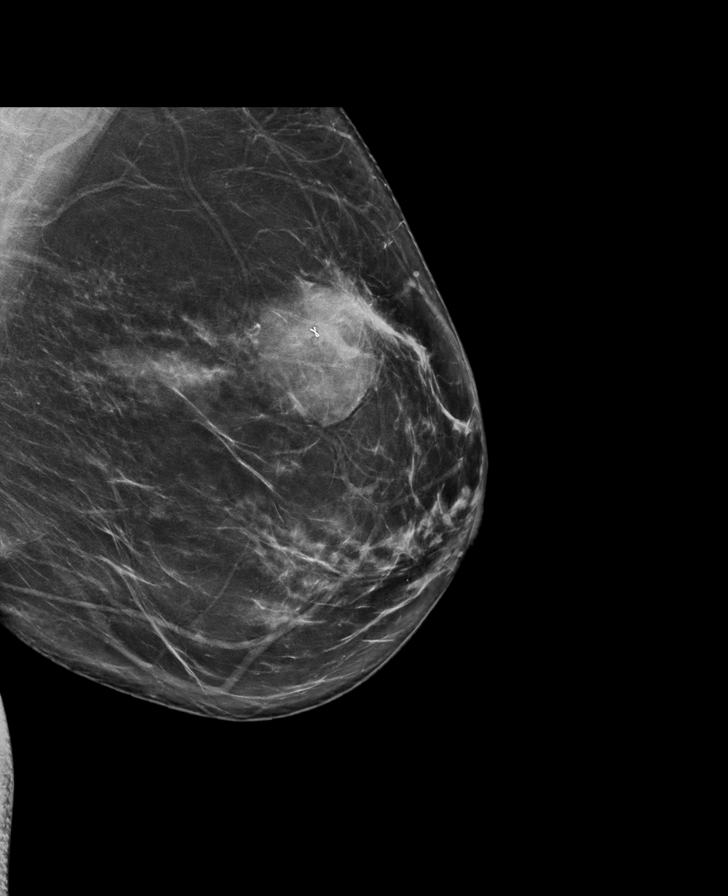

[L CC tomo · tomo slice 41/80.0]
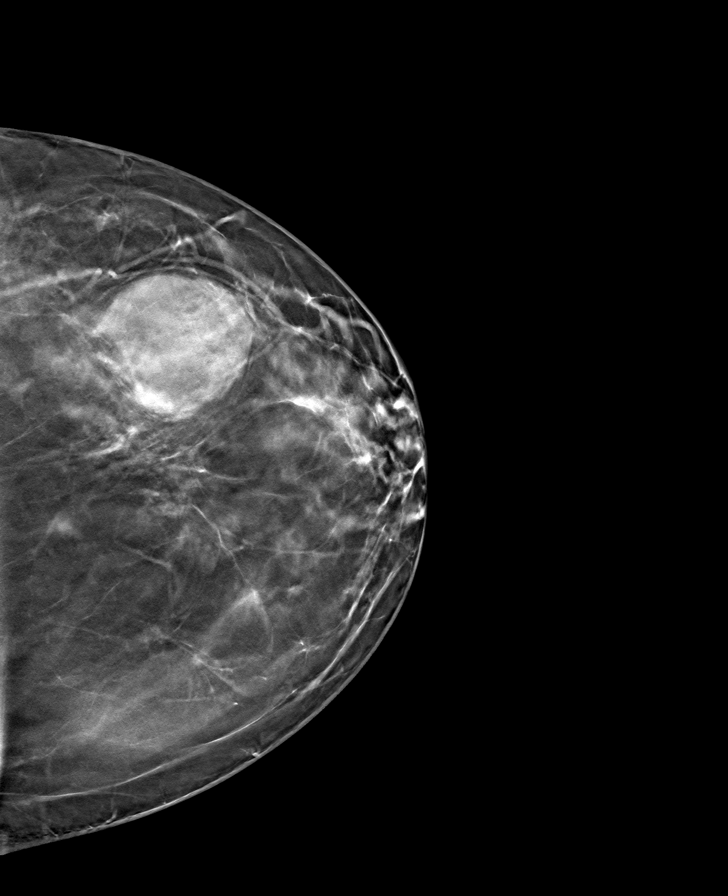

[R MLO tomo · tomo slice 43/86.0]
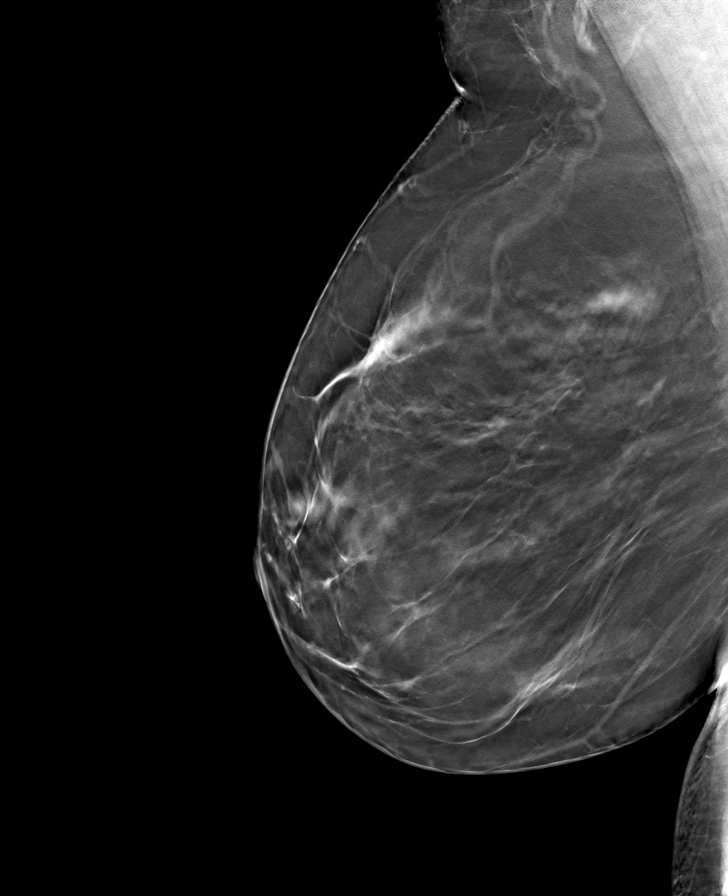

[L MLO tomo · tomo slice 45/89.0]
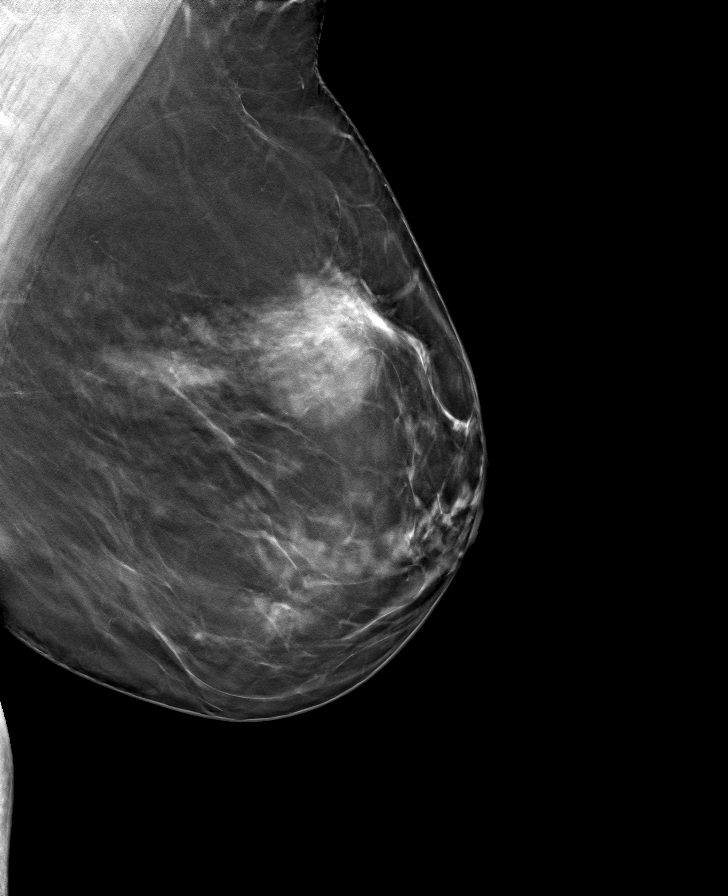

[R CC tomo · tomo slice 39/76.0]
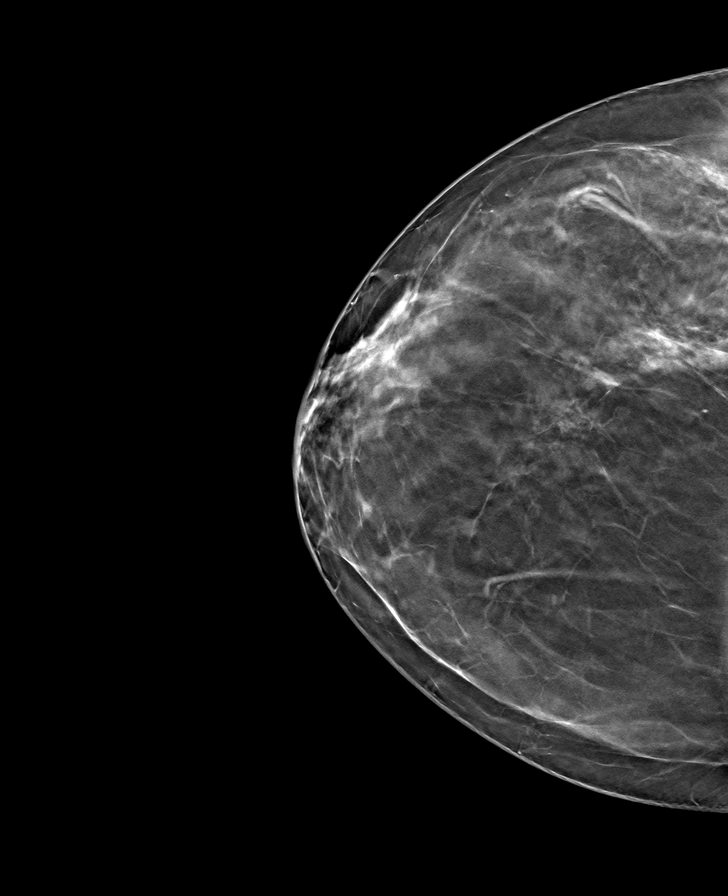

[8 of 24 positions shown; findings below may reference images not displayed]

ACR Breast Density Category b: There are scattered areas of
fibroglandular density.
FINDINGS: There are no findings suspicious for malignancy. Images were
processed with CAD.
IMPRESSION: No mammographic evidence of malignancy. A result letter of this
screening mammogram will be mailed directly to the patient.

RECOMMENDATION:
Screening mammogram in one year. (Code:CN-[id number])

BI-RADS CATEGORY  1: Negative.

## 2018-09-28 DIAGNOSIS — F419 Anxiety disorder, unspecified: Secondary | ICD-10-CM | POA: Diagnosis not present

## 2018-09-28 DIAGNOSIS — F3341 Major depressive disorder, recurrent, in partial remission: Secondary | ICD-10-CM | POA: Diagnosis not present

## 2018-09-28 DIAGNOSIS — F902 Attention-deficit hyperactivity disorder, combined type: Secondary | ICD-10-CM | POA: Diagnosis not present

## 2018-11-03 DIAGNOSIS — R631 Polydipsia: Secondary | ICD-10-CM | POA: Diagnosis not present

## 2018-11-03 DIAGNOSIS — J189 Pneumonia, unspecified organism: Secondary | ICD-10-CM | POA: Diagnosis not present

## 2018-11-03 DIAGNOSIS — R05 Cough: Secondary | ICD-10-CM | POA: Diagnosis not present

## 2018-11-08 DIAGNOSIS — J189 Pneumonia, unspecified organism: Secondary | ICD-10-CM | POA: Diagnosis not present

## 2018-11-30 ENCOUNTER — Other Ambulatory Visit: Payer: Self-pay | Admitting: Family Medicine

## 2018-11-30 ENCOUNTER — Ambulatory Visit
Admission: RE | Admit: 2018-11-30 | Discharge: 2018-11-30 | Disposition: A | Discharge status: Home / Self Care | Payer: BLUE CROSS/BLUE SHIELD | Source: Ambulatory Visit | Attending: Family Medicine | Admitting: Family Medicine

## 2018-11-30 DIAGNOSIS — R05 Cough: Secondary | ICD-10-CM | POA: Diagnosis not present

## 2018-11-30 DIAGNOSIS — J189 Pneumonia, unspecified organism: Secondary | ICD-10-CM

## 2019-03-25 DIAGNOSIS — F419 Anxiety disorder, unspecified: Secondary | ICD-10-CM | POA: Diagnosis not present

## 2019-03-25 DIAGNOSIS — F3341 Major depressive disorder, recurrent, in partial remission: Secondary | ICD-10-CM | POA: Diagnosis not present

## 2019-08-23 DIAGNOSIS — Z23 Encounter for immunization: Secondary | ICD-10-CM | POA: Diagnosis not present

## 2019-08-23 DIAGNOSIS — I10 Essential (primary) hypertension: Secondary | ICD-10-CM | POA: Diagnosis not present

## 2019-08-30 DIAGNOSIS — H16042 Marginal corneal ulcer, left eye: Secondary | ICD-10-CM | POA: Diagnosis not present

## 2019-09-12 DIAGNOSIS — Z6841 Body Mass Index (BMI) 40.0 and over, adult: Secondary | ICD-10-CM | POA: Diagnosis not present

## 2019-09-12 DIAGNOSIS — Z01419 Encounter for gynecological examination (general) (routine) without abnormal findings: Secondary | ICD-10-CM | POA: Diagnosis not present

## 2019-09-12 DIAGNOSIS — Z124 Encounter for screening for malignant neoplasm of cervix: Secondary | ICD-10-CM | POA: Diagnosis not present

## 2019-09-16 DIAGNOSIS — Z1231 Encounter for screening mammogram for malignant neoplasm of breast: Secondary | ICD-10-CM | POA: Diagnosis not present

## 2019-09-18 ENCOUNTER — Other Ambulatory Visit: Payer: Self-pay | Admitting: Obstetrics and Gynecology

## 2019-09-18 DIAGNOSIS — R928 Other abnormal and inconclusive findings on diagnostic imaging of breast: Secondary | ICD-10-CM

## 2019-09-20 ENCOUNTER — Ambulatory Visit
Admission: RE | Admit: 2019-09-20 | Discharge: 2019-09-20 | Disposition: A | Discharge status: Home / Self Care | Payer: BC Managed Care – PPO | Source: Ambulatory Visit | Attending: Obstetrics and Gynecology | Admitting: Obstetrics and Gynecology

## 2019-09-20 ENCOUNTER — Ambulatory Visit
Admission: RE | Admit: 2019-09-20 | Discharge: 2019-09-20 | Disposition: A | Discharge status: Home / Self Care | Payer: BLUE CROSS/BLUE SHIELD | Source: Ambulatory Visit | Attending: Obstetrics and Gynecology | Admitting: Obstetrics and Gynecology

## 2019-09-20 ENCOUNTER — Other Ambulatory Visit: Payer: BLUE CROSS/BLUE SHIELD

## 2019-09-20 ENCOUNTER — Other Ambulatory Visit: Payer: Self-pay

## 2019-09-20 DIAGNOSIS — N6002 Solitary cyst of left breast: Secondary | ICD-10-CM | POA: Diagnosis not present

## 2019-09-20 DIAGNOSIS — N6001 Solitary cyst of right breast: Secondary | ICD-10-CM | POA: Diagnosis not present

## 2019-09-20 DIAGNOSIS — Z13220 Encounter for screening for lipoid disorders: Secondary | ICD-10-CM | POA: Diagnosis not present

## 2019-09-20 DIAGNOSIS — Z0000 Encounter for general adult medical examination without abnormal findings: Secondary | ICD-10-CM | POA: Diagnosis not present

## 2019-09-20 DIAGNOSIS — R928 Other abnormal and inconclusive findings on diagnostic imaging of breast: Secondary | ICD-10-CM

## 2019-12-09 ENCOUNTER — Other Ambulatory Visit: Payer: BC Managed Care – PPO

## 2019-12-09 ENCOUNTER — Ambulatory Visit: Payer: BC Managed Care – PPO | Attending: Internal Medicine

## 2019-12-09 DIAGNOSIS — Z20822 Contact with and (suspected) exposure to covid-19: Secondary | ICD-10-CM | POA: Diagnosis not present

## 2019-12-10 LAB — NOVEL CORONAVIRUS, NAA: SARS-CoV-2, NAA: NOT DETECTED

## 2020-01-09 DIAGNOSIS — F419 Anxiety disorder, unspecified: Secondary | ICD-10-CM | POA: Diagnosis not present

## 2020-01-09 DIAGNOSIS — F3341 Major depressive disorder, recurrent, in partial remission: Secondary | ICD-10-CM | POA: Diagnosis not present

## 2020-02-26 DIAGNOSIS — Z79899 Other long term (current) drug therapy: Secondary | ICD-10-CM | POA: Diagnosis not present

## 2020-03-10 DIAGNOSIS — L709 Acne, unspecified: Secondary | ICD-10-CM | POA: Diagnosis not present

## 2020-03-10 DIAGNOSIS — J302 Other seasonal allergic rhinitis: Secondary | ICD-10-CM | POA: Diagnosis not present

## 2020-03-20 DIAGNOSIS — J302 Other seasonal allergic rhinitis: Secondary | ICD-10-CM | POA: Diagnosis not present

## 2020-03-20 DIAGNOSIS — I10 Essential (primary) hypertension: Secondary | ICD-10-CM | POA: Diagnosis not present

## 2020-03-20 DIAGNOSIS — K219 Gastro-esophageal reflux disease without esophagitis: Secondary | ICD-10-CM | POA: Diagnosis not present

## 2020-03-20 DIAGNOSIS — F419 Anxiety disorder, unspecified: Secondary | ICD-10-CM | POA: Diagnosis not present

## 2020-04-04 DIAGNOSIS — I10 Essential (primary) hypertension: Secondary | ICD-10-CM | POA: Diagnosis not present

## 2020-04-04 DIAGNOSIS — I80251 Phlebitis and thrombophlebitis of right calf muscular vein: Secondary | ICD-10-CM | POA: Diagnosis not present

## 2020-04-04 DIAGNOSIS — F99 Mental disorder, not otherwise specified: Secondary | ICD-10-CM | POA: Diagnosis not present

## 2020-04-04 DIAGNOSIS — E876 Hypokalemia: Secondary | ICD-10-CM | POA: Diagnosis not present

## 2020-04-04 DIAGNOSIS — M79661 Pain in right lower leg: Secondary | ICD-10-CM | POA: Diagnosis not present

## 2020-04-04 DIAGNOSIS — Z79899 Other long term (current) drug therapy: Secondary | ICD-10-CM | POA: Diagnosis not present

## 2020-04-04 DIAGNOSIS — I8002 Phlebitis and thrombophlebitis of superficial vessels of left lower extremity: Secondary | ICD-10-CM | POA: Diagnosis not present

## 2020-04-04 DIAGNOSIS — M7989 Other specified soft tissue disorders: Secondary | ICD-10-CM | POA: Diagnosis not present

## 2020-04-14 DIAGNOSIS — I8001 Phlebitis and thrombophlebitis of superficial vessels of right lower extremity: Secondary | ICD-10-CM | POA: Diagnosis not present

## 2020-04-14 DIAGNOSIS — E876 Hypokalemia: Secondary | ICD-10-CM | POA: Diagnosis not present

## 2020-05-14 DIAGNOSIS — E876 Hypokalemia: Secondary | ICD-10-CM | POA: Diagnosis not present

## 2020-05-14 DIAGNOSIS — L700 Acne vulgaris: Secondary | ICD-10-CM | POA: Diagnosis not present

## 2020-05-16 DIAGNOSIS — H11123 Conjunctival concretions, bilateral: Secondary | ICD-10-CM | POA: Diagnosis not present

## 2020-05-16 DIAGNOSIS — H02054 Trichiasis without entropian left upper eyelid: Secondary | ICD-10-CM | POA: Diagnosis not present

## 2020-06-22 DIAGNOSIS — Z20822 Contact with and (suspected) exposure to covid-19: Secondary | ICD-10-CM | POA: Diagnosis not present

## 2020-09-23 DIAGNOSIS — J019 Acute sinusitis, unspecified: Secondary | ICD-10-CM | POA: Diagnosis not present

## 2020-10-21 DIAGNOSIS — Z23 Encounter for immunization: Secondary | ICD-10-CM | POA: Diagnosis not present

## 2020-11-12 DIAGNOSIS — L237 Allergic contact dermatitis due to plants, except food: Secondary | ICD-10-CM | POA: Diagnosis not present

## 2020-11-19 DIAGNOSIS — F419 Anxiety disorder, unspecified: Secondary | ICD-10-CM | POA: Diagnosis not present

## 2020-11-19 DIAGNOSIS — L719 Rosacea, unspecified: Secondary | ICD-10-CM | POA: Diagnosis not present

## 2020-11-19 DIAGNOSIS — I10 Essential (primary) hypertension: Secondary | ICD-10-CM | POA: Diagnosis not present

## 2020-11-19 DIAGNOSIS — Z0000 Encounter for general adult medical examination without abnormal findings: Secondary | ICD-10-CM | POA: Diagnosis not present

## 2020-11-19 DIAGNOSIS — K219 Gastro-esophageal reflux disease without esophagitis: Secondary | ICD-10-CM | POA: Diagnosis not present

## 2020-11-19 DIAGNOSIS — F902 Attention-deficit hyperactivity disorder, combined type: Secondary | ICD-10-CM | POA: Diagnosis not present

## 2020-11-19 DIAGNOSIS — F3341 Major depressive disorder, recurrent, in partial remission: Secondary | ICD-10-CM | POA: Diagnosis not present

## 2020-11-19 DIAGNOSIS — Z13220 Encounter for screening for lipoid disorders: Secondary | ICD-10-CM | POA: Diagnosis not present

## 2020-11-19 DIAGNOSIS — Z79899 Other long term (current) drug therapy: Secondary | ICD-10-CM | POA: Diagnosis not present

## 2020-11-27 DIAGNOSIS — Z1231 Encounter for screening mammogram for malignant neoplasm of breast: Secondary | ICD-10-CM | POA: Diagnosis not present

## 2020-11-27 DIAGNOSIS — Z01419 Encounter for gynecological examination (general) (routine) without abnormal findings: Secondary | ICD-10-CM | POA: Diagnosis not present

## 2020-11-27 DIAGNOSIS — Z6834 Body mass index (BMI) 34.0-34.9, adult: Secondary | ICD-10-CM | POA: Diagnosis not present

## 2020-11-30 ENCOUNTER — Other Ambulatory Visit: Payer: Self-pay | Admitting: Obstetrics and Gynecology

## 2020-11-30 DIAGNOSIS — R928 Other abnormal and inconclusive findings on diagnostic imaging of breast: Secondary | ICD-10-CM

## 2020-12-04 DIAGNOSIS — D72829 Elevated white blood cell count, unspecified: Secondary | ICD-10-CM | POA: Diagnosis not present

## 2020-12-09 DIAGNOSIS — I8312 Varicose veins of left lower extremity with inflammation: Secondary | ICD-10-CM | POA: Diagnosis not present

## 2020-12-09 DIAGNOSIS — I8311 Varicose veins of right lower extremity with inflammation: Secondary | ICD-10-CM | POA: Diagnosis not present

## 2020-12-11 ENCOUNTER — Ambulatory Visit
Admission: RE | Admit: 2020-12-11 | Discharge: 2020-12-11 | Disposition: A | Discharge status: Home / Self Care | Payer: BC Managed Care – PPO | Source: Ambulatory Visit | Attending: Obstetrics and Gynecology | Admitting: Obstetrics and Gynecology

## 2020-12-11 ENCOUNTER — Other Ambulatory Visit: Payer: Self-pay

## 2020-12-11 ENCOUNTER — Ambulatory Visit: Payer: BC Managed Care – PPO

## 2020-12-11 DIAGNOSIS — R928 Other abnormal and inconclusive findings on diagnostic imaging of breast: Secondary | ICD-10-CM | POA: Diagnosis not present

## 2020-12-24 DIAGNOSIS — I8311 Varicose veins of right lower extremity with inflammation: Secondary | ICD-10-CM | POA: Diagnosis not present

## 2020-12-24 DIAGNOSIS — I8312 Varicose veins of left lower extremity with inflammation: Secondary | ICD-10-CM | POA: Diagnosis not present

## 2020-12-25 DIAGNOSIS — K219 Gastro-esophageal reflux disease without esophagitis: Secondary | ICD-10-CM | POA: Diagnosis not present

## 2020-12-25 DIAGNOSIS — Z1211 Encounter for screening for malignant neoplasm of colon: Secondary | ICD-10-CM | POA: Diagnosis not present

## 2020-12-25 DIAGNOSIS — Z800 Family history of malignant neoplasm of digestive organs: Secondary | ICD-10-CM | POA: Diagnosis not present

## 2021-01-19 DIAGNOSIS — I8311 Varicose veins of right lower extremity with inflammation: Secondary | ICD-10-CM | POA: Diagnosis not present

## 2021-01-19 DIAGNOSIS — I8312 Varicose veins of left lower extremity with inflammation: Secondary | ICD-10-CM | POA: Diagnosis not present

## 2021-02-17 DIAGNOSIS — Z800 Family history of malignant neoplasm of digestive organs: Secondary | ICD-10-CM | POA: Diagnosis not present

## 2021-02-17 DIAGNOSIS — Z1211 Encounter for screening for malignant neoplasm of colon: Secondary | ICD-10-CM | POA: Diagnosis not present

## 2021-04-06 IMAGING — MG MM DIGITAL DIAGNOSTIC UNILAT*R* W/ TOMO W/ CAD
6 series · 6 of 18 positions shown · non-contrast
Comparison: Previous exam(s).

CLINICAL DATA: Patient was recalled from screening mammogram for a
possible asymmetry in the right breast.

EXAM:
DIGITAL DIAGNOSTIC UNILATERAL RIGHT MAMMOGRAM WITH TOMOSYNTHESIS AND
CAD
TECHNIQUE: Right digital diagnostic mammography and breast tomosynthesis was
performed. Digital images of the breasts were evaluated with
computer-aided detection.

[R MLO synth-2D (1 of 2)]
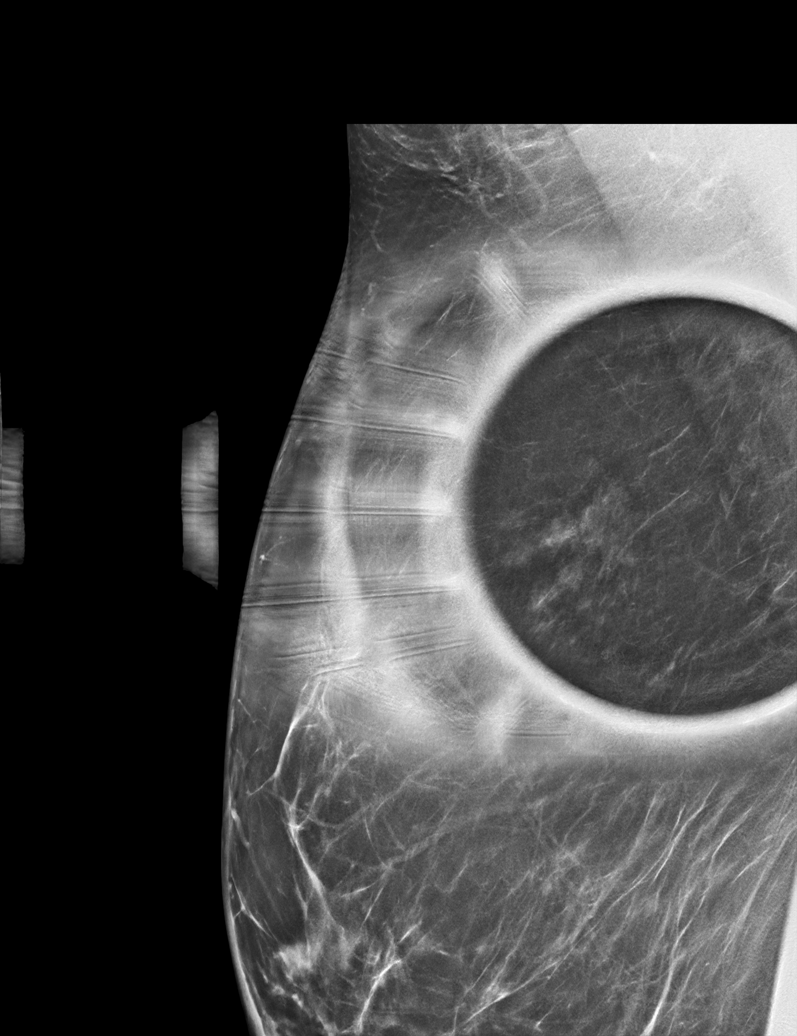

[R MLO synth-2D (2 of 2)]
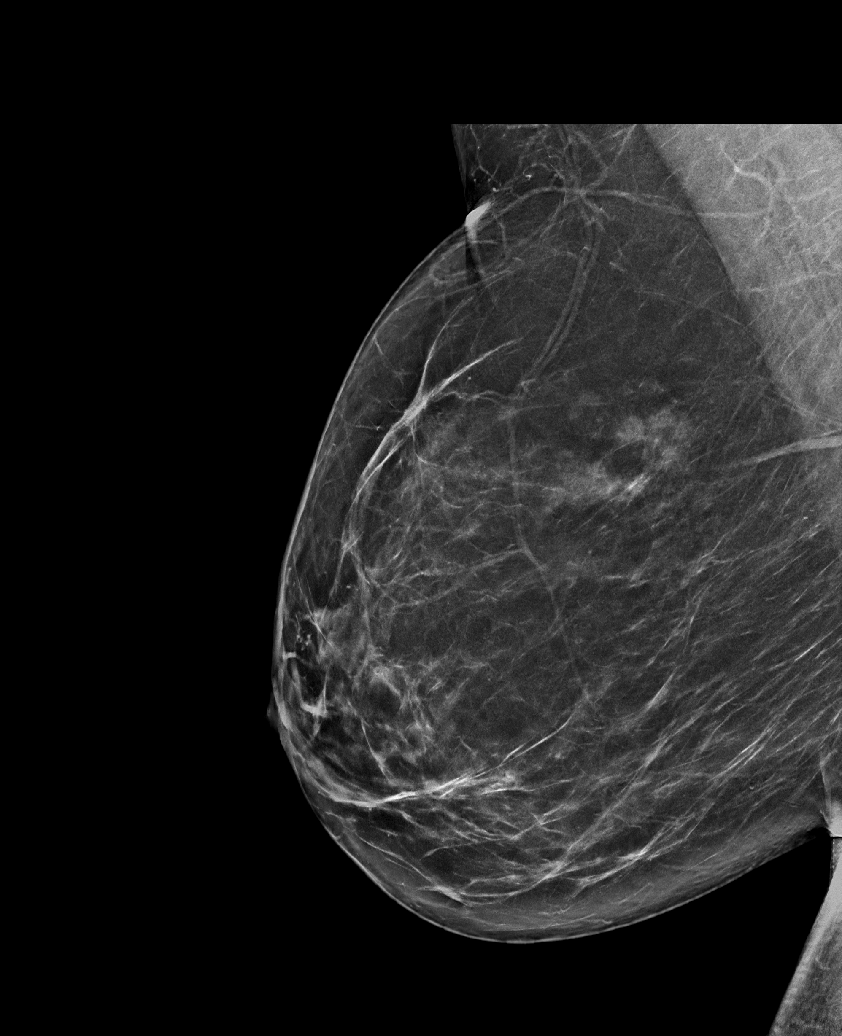

[R ML synth-2D]
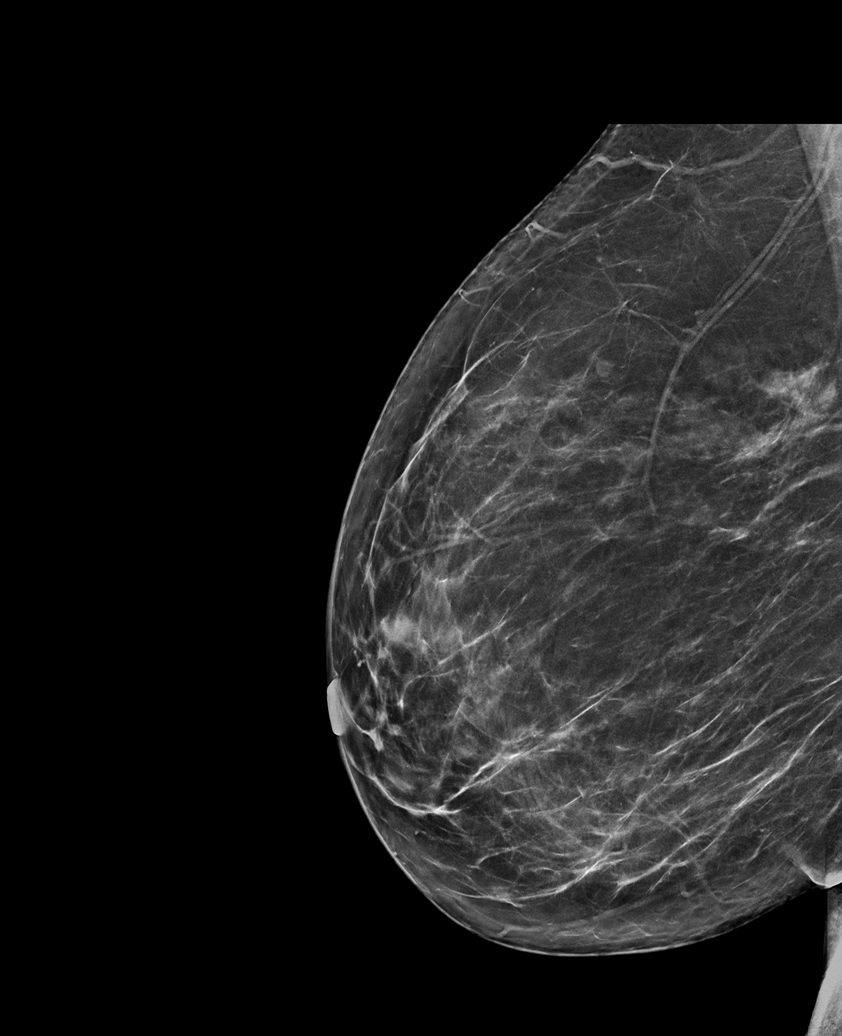

[R MLO tomo (1 of 2) · tomo slice 38/75.0]
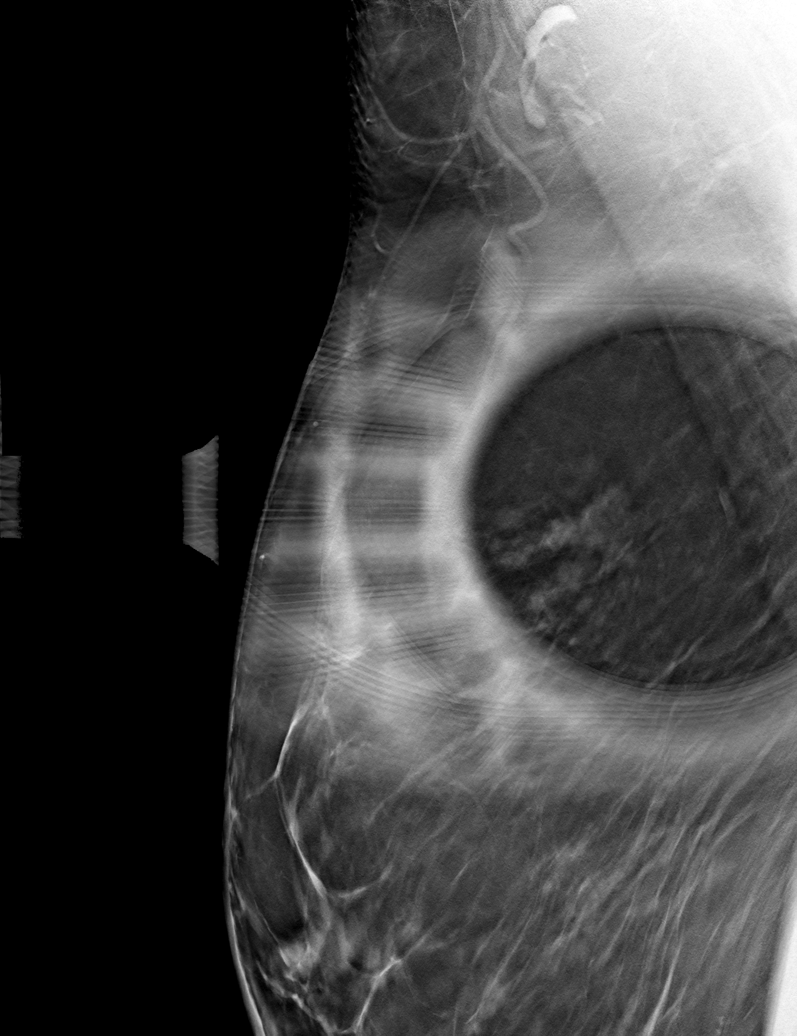

[R MLO tomo (2 of 2) · tomo slice 40/79.0]
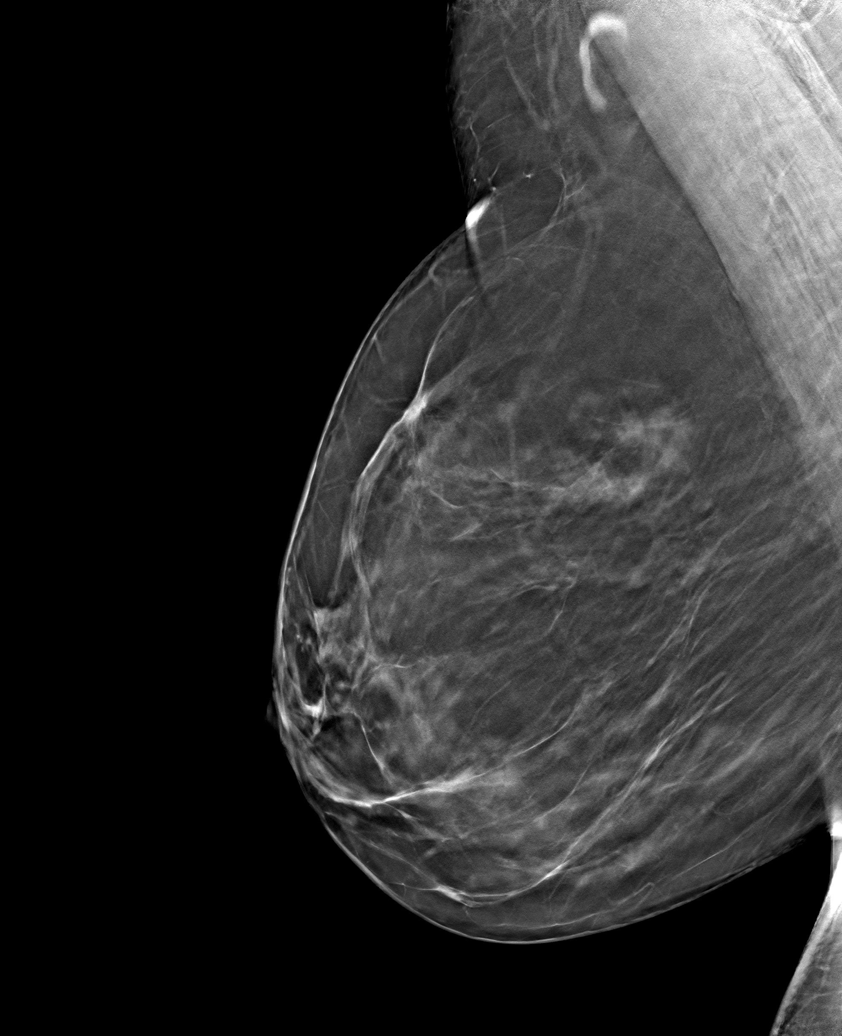

[R ML tomo · tomo slice 37/73.0]
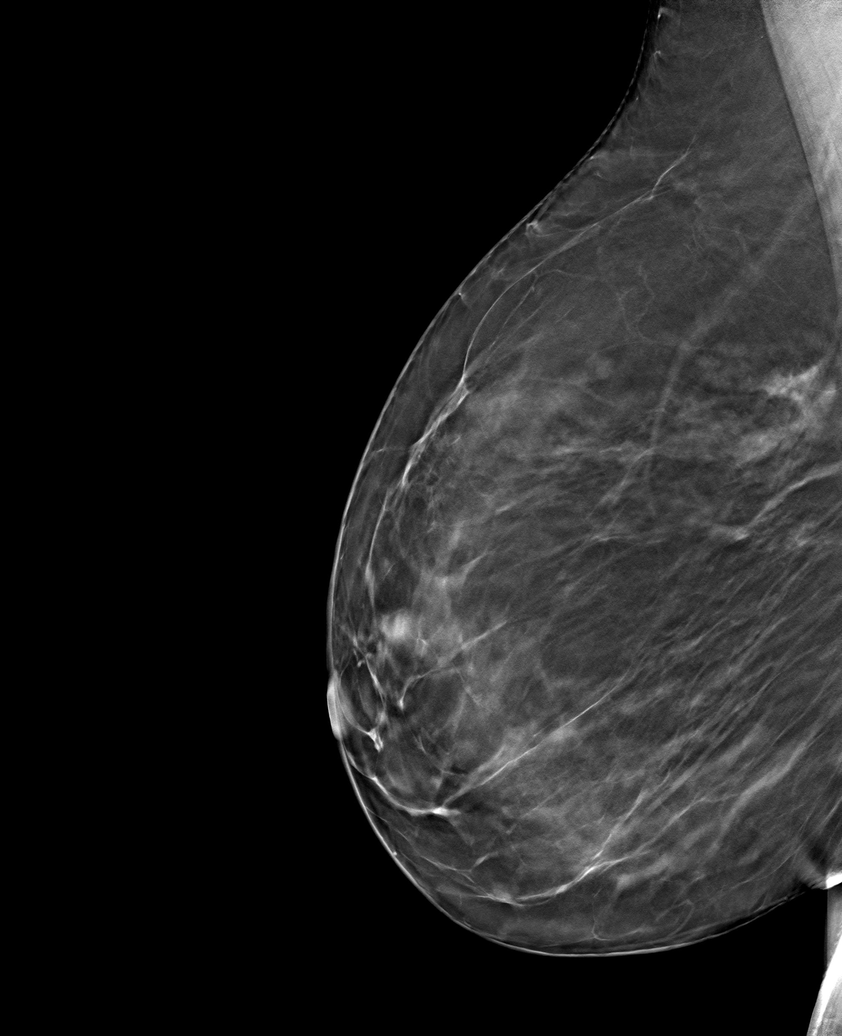

[6 of 18 positions shown; findings below may reference images not displayed]

ACR Breast Density Category b: There are scattered areas of
fibroglandular density.
FINDINGS: Additional imaging of the right breast was performed. The asymmetry
in the superior aspect of the right breast has a stable appearance
compared to prior exams dating back to 5784. No suspicious mass,
distortion or malignant type microcalcifications identified.
IMPRESSION: No evidence of malignancy in the right breast.

RECOMMENDATION:
Screening mammogram in one year.(Code:JY-5-0AC)

I have discussed the findings and recommendations with the patient.
If applicable, a reminder letter will be sent to the patient
regarding the next appointment.

BI-RADS CATEGORY  1: Negative.

## 2022-08-01 ENCOUNTER — Other Ambulatory Visit (HOSPITAL_COMMUNITY): Payer: Self-pay

## 2022-08-01 MED ORDER — OZEMPIC (1 MG/DOSE) 4 MG/3ML ~~LOC~~ SOPN
1.0000 mg | PEN_INJECTOR | SUBCUTANEOUS | 0 refills | Status: AC
  Filled 2022-08-01: qty 3, 28d supply, fill #0

## 2022-08-08 ENCOUNTER — Other Ambulatory Visit (HOSPITAL_COMMUNITY): Payer: Self-pay

## 2022-08-08 MED ORDER — WEGOVY 1.7 MG/0.75ML ~~LOC~~ SOAJ
1.7000 mg | SUBCUTANEOUS | 0 refills | Status: AC
  Filled 2022-08-08: qty 3, 28d supply, fill #0

## 2023-05-18 ENCOUNTER — Emergency Department (HOSPITAL_COMMUNITY)
Admission: EM | Admit: 2023-05-18 | Discharge: 2023-05-18 | Disposition: A | Discharge status: Home / Self Care | Payer: 59 | Attending: Emergency Medicine | Admitting: Emergency Medicine

## 2023-05-18 ENCOUNTER — Encounter (HOSPITAL_COMMUNITY): Payer: Self-pay | Admitting: Emergency Medicine

## 2023-05-18 ENCOUNTER — Other Ambulatory Visit: Payer: Self-pay

## 2023-05-18 ENCOUNTER — Emergency Department (HOSPITAL_COMMUNITY): Payer: 59

## 2023-05-18 DIAGNOSIS — J101 Influenza due to other identified influenza virus with other respiratory manifestations: Secondary | ICD-10-CM | POA: Insufficient documentation

## 2023-05-18 DIAGNOSIS — J45909 Unspecified asthma, uncomplicated: Secondary | ICD-10-CM | POA: Diagnosis not present

## 2023-05-18 DIAGNOSIS — Z1152 Encounter for screening for COVID-19: Secondary | ICD-10-CM | POA: Insufficient documentation

## 2023-05-18 DIAGNOSIS — J029 Acute pharyngitis, unspecified: Secondary | ICD-10-CM | POA: Diagnosis present

## 2023-05-18 DIAGNOSIS — J111 Influenza due to unidentified influenza virus with other respiratory manifestations: Secondary | ICD-10-CM

## 2023-05-18 LAB — COMPREHENSIVE METABOLIC PANEL
ALT: 16 U/L (ref 0–44)
AST: 21 U/L (ref 15–41)
Albumin: 3.4 g/dL — ABNORMAL LOW (ref 3.5–5.0)
Alkaline Phosphatase: 75 U/L (ref 38–126)
Anion gap: 10 (ref 5–15)
BUN: 14 mg/dL (ref 6–20)
CO2: 22 mmol/L (ref 22–32)
Calcium: 8.2 mg/dL — ABNORMAL LOW (ref 8.9–10.3)
Chloride: 103 mmol/L (ref 98–111)
Creatinine, Ser: 0.8 mg/dL (ref 0.44–1.00)
GFR, Estimated: >60 mL/min (ref >60)
Glucose, Bld: 126 mg/dL — ABNORMAL HIGH (ref 70–99)
Potassium: 3 mmol/L — ABNORMAL LOW (ref 3.5–5.1)
Sodium: 135 mmol/L (ref 135–145)
Total Bilirubin: 0.4 mg/dL (ref 0.3–1.2)
Total Protein: 6.7 g/dL (ref 6.5–8.1)

## 2023-05-18 LAB — CBC WITH DIFFERENTIAL/PLATELET
Abs Immature Granulocytes: 0.05 10*3/uL (ref 0.00–0.07)
Basophils Absolute: 0 10*3/uL (ref 0.0–0.1)
Basophils Relative: 1 %
Eosinophils Absolute: 0 10*3/uL (ref 0.0–0.5)
Eosinophils Relative: 1 %
HCT: 40.7 % (ref 36.0–46.0)
Hemoglobin: 13.6 g/dL (ref 12.0–15.0)
Immature Granulocytes: 1 %
Lymphocytes Relative: 19 %
Lymphs Abs: 0.7 10*3/uL (ref 0.7–4.0)
MCH: 29.5 pg (ref 26.0–34.0)
MCHC: 33.4 g/dL (ref 30.0–36.0)
MCV: 88.3 fL (ref 80.0–100.0)
Monocytes Absolute: 0.3 10*3/uL (ref 0.1–1.0)
Monocytes Relative: 8 %
Neutro Abs: 2.6 10*3/uL (ref 1.7–7.7)
Neutrophils Relative %: 70 %
Platelets: 261 10*3/uL (ref 150–400)
RBC: 4.61 MIL/uL (ref 3.87–5.11)
RDW: 13.9 % (ref 11.5–15.5)
WBC: 3.7 10*3/uL — ABNORMAL LOW (ref 4.0–10.5)
nRBC: 0 % (ref 0.0–0.2)

## 2023-05-18 LAB — RESP PANEL BY RT-PCR (RSV, FLU A&B, COVID)  RVPGX2
Influenza A by PCR: POSITIVE — AB
Influenza B by PCR: NEGATIVE
Resp Syncytial Virus by PCR: NEGATIVE
SARS Coronavirus 2 by RT PCR: NEGATIVE

## 2023-05-18 LAB — TECHNOLOGIST SMEAR REVIEW: Plt Morphology: NORMAL

## 2023-05-18 LAB — LACTATE DEHYDROGENASE: LDH: 176 U/L (ref 98–192)

## 2023-05-18 LAB — SEDIMENTATION RATE: Sed Rate: 18 mm/hr (ref 0–22)

## 2023-05-18 MED ORDER — ACETAMINOPHEN 500 MG PO TABS
1000.0000 mg | ORAL_TABLET | Freq: Once | ORAL | Status: AC
  Administered 2023-05-18: 1000 mg via ORAL
  Filled 2023-05-18: qty 2

## 2023-05-18 MED ORDER — LACTATED RINGERS IV BOLUS
1000.0000 mL | Freq: Once | INTRAVENOUS | Status: AC
  Administered 2023-05-18: 1000 mL via INTRAVENOUS

## 2023-05-18 NOTE — ED Notes (Signed)
IV team at the bedside. 

## 2023-05-18 NOTE — ED Notes (Signed)
Patient transported to X-ray 

## 2023-05-18 NOTE — ED Notes (Signed)
Pt states she was in [place] 3 weeks ago and bit by a mosquito. Pt was taking Malarone but symptoms hasn't subsided. Pt says she is experiencing fevers, diaphoresis, chills, aches all over, and cough that has resulted to a sore throat.   Pt says she has concerns about her pinch nerve that she has been dealing with for a month. Pt says she is u/t to sit for long periods without shooting pain down her neck and right arm. Pt says it causes her arm to swell, numbness, and tingling at times.

## 2023-05-18 NOTE — Discharge Instructions (Signed)
You are seen today and evaluated for your fever, and flulike symptoms.  We got a workup to evaluate you for malaria, dengue fever, and other viral infections.  This showed that you have the flu.  It is important to continue to take your medications against malaria, and treat your flu symptoms with plenty of fluids, and Tylenol.  You may also take ibuprofen if needed for additional pain control.  You can alternate these 2 every 3-4 hours.  Do not exceed more than 4 g of Tylenol in a 24-hour period, and only take it every 6 hours.  Your last dose of Tylenol was given at 5:30 PM today.  Please plan to follow-up with your primary care doctor early next week to repeat your labs and ensure that you do not have any liver abnormalities that might signal a coinfection with malaria.  Return to the emergency department if you have any new or worsening symptoms.

## 2023-05-18 NOTE — ED Triage Notes (Signed)
Patient arrives ambulatory by POV requesting to be checked for malaria. Patient was recently in [place] and states she was bit by a mosquito. States she was on medication before. Reports cough, fevers, chills and sweating since Monday.

## 2023-05-18 NOTE — ED Provider Notes (Signed)
[place] [facility]ENC[facility] DE[facility]MENT AT [facility] Provider Note  MDM   HPI/ROS:  Sylvia Watkins is a 52 y.o. female with a medical history as below who presents for evaluation of fever, malaise, cough, sore throat.  She reports that she was traveling in [place] and returned back to the [place] 5 days ago.  While traveling, she did notice several places that she believed to be mosquito bites.  She developed mild malaise and fatigue on the flight home, and since she has been back in the country she reports that she has had worsening malaise and a cough.  Overnight last night she had a significant fever with a Tmax of 104.  She took Tylenol, and continued to fever through the night.  This morning when she woke up she felt chilled, and had profuse diaphoresis reports that she "soaked through 2 shirts."  She is concerned about possible malaria, she was on Malarone while abroad.  The CDC called her and requested that she come to the emergency department to rule out malaria and dengue fever.  Physical exam is notable for: - Persistent cough - Erythematous oropharynx without tonsillar exudate - Clear and equal bilateral breath sounds - No evidence of rash  On my initial evaluation, patient is:  -Vital signs stable. Patient afebrile, hemodynamically stable, and non-toxic appearing. -Patient's presentation is most consistent with acute complicated illness / injury requiring diagnostic workup.. -Additional history obtained from husband, chart review  This patient's current presentation, including their history and physical exam, is most consistent with acute viral illness including possibility of both dengue fever and malaria given her travels. Differentials include COVID/flu/other viral URI, pneumonia.    Interpretations, interventions, and the patient's course of care are documented below.    Clinical Course as of 05/18/23 2228  Thu May 18, 2023  1642 Comprehensive  metabolic panel(!) No overt evidence of LFT abnormality.  [BB]  1643 CBC with Differential(!) No evidence of anemia [BB]  1643 DG Chest 2 View No evidence of pneumonia [BB]  1650 Discussed the case with the on-call infectious disease doctor who agreed with workup, suggested that the patient follow-up with her primary care early next week to evaluate for any liver abnormalities and repeat labs.  I discussed this with the patient. [BB]  1755 Influenza A By PCR(!): POSITIVE Patient positive for influenza [BB]  1800 Discussed the influenza diagnosis with the patient at bedside, and we engaged the shared decision making regarding any Tamiflu prescription.  Patient declined.  [BB]    Clinical Course User Index [BB] Fayrene Helper, MD     Disposition:  I discussed the plan for discharge with the patient and/or their surrogate at bedside prior to discharge and they were in agreement with the plan and verbalized understanding of the return precautions provided. All questions answered to the best of my ability. Ultimately, the patient was discharged in stable condition with stable vital signs. I am reassured that they are capable of close follow up and good social support at home.   Clinical Impression:  1. Influenza     Rx / DC Orders ED Discharge Orders     None       The plan for this patient was discussed with Dr. Adela Lank, who voiced agreement and who oversaw evaluation and treatment of this patient.   Clinical Complexity A medically appropriate history, review of systems, and physical exam was performed.  My independent interpretations of EKG, labs, and radiology are documented in the  ED course above.   If decision rules were used in this patient's evaluation, they are listed below.   Click here for ABCD2, HEART and other calculatorsREFRESH Note before signing   Patient's presentation is most consistent with acute complicated illness / injury requiring diagnostic  workup.  Medical Decision Making Amount and/or Complexity of Data Reviewed Labs: ordered. Decision-making details documented in ED Course. Radiology: ordered. Decision-making details documented in ED Course.  Risk OTC drugs.    HPI/ROS      See MDM section for pertinent HPI and ROS. A complete ROS was performed with pertinent positives/negatives noted above.   Past Medical History:  Diagnosis Date   Anxiety    Asthma    Depression    GERD (gastroesophageal reflux disease)     Past Surgical History:  Procedure Laterality Date   BREAST BIOPSY Left 2013   TEE WITHOUT CARDIOVERSION  08/21/2012   Procedure: TRANSESOPHAGEAL ECHOCARDIOGRAM (TEE);  Surgeon: Pamella Pert, MD;  Location: [facility] ENDOSCOPY;  Service: Cardiovascular;  Laterality: N/A;   TONSILLECTOMY N/A 03/10/2014   Procedure: TONSILLECTOMY;  Surgeon: Darletta Moll, MD;  Location: [facility];  Service: ENT;  Laterality: N/A;   UPPER GI ENDOSCOPY     WISDOM TOOTH EXTRACTION        Physical Exam   Vitals:   05/18/23 1346 05/18/23 1347 05/18/23 1816  BP: 131/85  136/88  Pulse: 79  72  Resp: 18  16  Temp: 99 F (37.2 C)  99 F (37.2 C)  TempSrc: Oral  Oral  SpO2: 99%  100%  Weight:  86.2 kg   Height:  5\' 8"  (1.727 m)     Physical Exam Vitals and nursing note reviewed.  Constitutional:      General: She is not in acute distress.    Appearance: She is well-developed.  HENT:     Head: Normocephalic and atraumatic.  Eyes:     Conjunctiva/sclera: Conjunctivae normal.  Cardiovascular:     Rate and Rhythm: Normal rate and regular rhythm.     Heart sounds: No murmur heard. Pulmonary:     Effort: Pulmonary effort is normal. No respiratory distress.     Breath sounds: Normal breath sounds.  Abdominal:     Palpations: Abdomen is soft.     Tenderness: There is no abdominal tenderness.  Musculoskeletal:        General: No swelling.     Cervical back: Neck supple.  Skin:    General: Skin is warm  and dry.     Capillary Refill: Capillary refill takes less than 2 seconds.  Neurological:     Mental Status: She is alert.  Psychiatric:        Mood and Affect: Mood normal.      Procedures   If procedures were preformed on this patient, they are listed below:  Procedures   Fayrene Helper, MD Emergency Medicine PGY-2   Please note that this documentation was produced with the assistance of voice-to-text technology and may contain errors.    Fayrene Helper, MD 05/18/23 2228    Melene Plan, DO 05/18/23 2242

## 2023-05-19 LAB — HAPTOGLOBIN: Haptoglobin: 240 mg/dL (ref 42–296)

## 2023-05-20 LAB — PLASMODIUM SP. PCR: Plasmodium Sp. PCR: NEGATIVE

## 2024-04-17 ENCOUNTER — Encounter (INDEPENDENT_AMBULATORY_CARE_PROVIDER_SITE_OTHER): Payer: Self-pay

## 2024-07-01 ENCOUNTER — Ambulatory Visit (INDEPENDENT_AMBULATORY_CARE_PROVIDER_SITE_OTHER): Admitting: Otolaryngology

## 2024-07-01 ENCOUNTER — Encounter (INDEPENDENT_AMBULATORY_CARE_PROVIDER_SITE_OTHER): Payer: Self-pay | Admitting: Otolaryngology

## 2024-07-01 VITALS — BP 150/86 | HR 69 | Ht 68.0 in | Wt 213.0 lb

## 2024-07-01 DIAGNOSIS — J342 Deviated nasal septum: Secondary | ICD-10-CM | POA: Diagnosis not present

## 2024-07-01 DIAGNOSIS — J343 Hypertrophy of nasal turbinates: Secondary | ICD-10-CM

## 2024-07-01 DIAGNOSIS — R0981 Nasal congestion: Secondary | ICD-10-CM | POA: Diagnosis not present

## 2024-07-01 DIAGNOSIS — J34829 Nasal valve collapse, unspecified: Secondary | ICD-10-CM

## 2024-07-01 DIAGNOSIS — J3089 Other allergic rhinitis: Secondary | ICD-10-CM

## 2024-07-01 DIAGNOSIS — J3489 Other specified disorders of nose and nasal sinuses: Secondary | ICD-10-CM

## 2024-07-01 DIAGNOSIS — G4733 Obstructive sleep apnea (adult) (pediatric): Secondary | ICD-10-CM

## 2024-07-01 NOTE — Patient Instructions (Addendum)
 Septoplasty: CPT 30520; CPT 30140 Rhinoplasty: CPT 30520; CPT 30140; CPT 30465; CPT 15769   Stop zepbound a week before surgery (last shot has to be at least 1 week or more prior to surgery)

## 2024-07-01 NOTE — Progress Notes (Signed)
 Otolaryngology Clinic Note  HISTORY: Sylvia Watkins is a 53 y.o. female kindly referred for evaluation of OSA and nasal obstruction  Initial visit (06/2024): She reports that she has had chronic nasal congestion, bilateral, does not think one side worse than other consistently. She has had it for my whole life -- she feels like if she inhales deeply, she has a fair amount of nasal collapse. She has tried breathe right strips and they do not help.   She does have moderate OSA, and has been unable to tolerate CPAP with several mask trials, she feels partly due to nasal obstruction. She does have an oral appliance, which helps control her snoring significantly but she continues to have significant nasal obstruction which she feels like does not help with her sleep at night. She reports that she does have typical AR symptoms with PND.   She is currently using xyzal, flonase, and astelin. She has tried nasal rinses but only once or twice. No nasal decongestant use She uses flonase and xyzal consistently.  Allergy testing has not been done. No previous sinonasal surgery.  No nasal trauma, no frequent sinus infections or CRS symptoms including discolored drainage, facial pressure/pain  GLP-1: Zebound AP/AC: no  Tobacco: no  ENT surgery: Tonsillectomy (Dr. Karis - 2015)  PMHx: HTN, GERD, MDD/OCD/ADHD  RADIOGRAPHIC EVALUATION AND INDEPENDENT REVIEW OF OTHER RECORDS:: Marinell Sayers Referral notes reviewed and uploaded or available in chart in media tab (6/6/202): noted OSA, prior seen by [facility]; started on CPAP but issue with mask leaks; noted persistent OSA - rec dental device Sleep study reviewed (2024) (AHI 15.2, Central 3.4 O2 nadir 77%) Dr. Harl Ee Sleep Notes (03/2024) reviewed: AHI 15.2, rec CPAP - failed, rec oral appliance CBC and CMP 05/18/2023: WBC 3.7, Eos 0; BUN/Cr 14/0.8 Past Medical History:  Diagnosis Date   Anxiety    Asthma    Depression    GERD (gastroesophageal  reflux disease)    Past Surgical History:  Procedure Laterality Date   BREAST BIOPSY Left 2013   TEE WITHOUT CARDIOVERSION  08/21/2012   Procedure: TRANSESOPHAGEAL ECHOCARDIOGRAM (TEE);  Surgeon: Erick JONELLE Bergamo, MD;  Location: [facility] ENDOSCOPY;  Service: Cardiovascular;  Laterality: N/A;   TONSILLECTOMY N/A 03/10/2014   Procedure: TONSILLECTOMY;  Surgeon: Ana LELON Karis, MD;  Location: [facility];  Service: ENT;  Laterality: N/A;   UPPER GI ENDOSCOPY     WISDOM TOOTH EXTRACTION     Family History  Problem Relation Age of Onset   Breast cancer Maternal Aunt        in 45's or 37's   Social History   Tobacco Use   Smoking status: Never   Smokeless tobacco: Never  Substance Use Topics   Alcohol use: Yes    Comment: occ   No Known Allergies Current Outpatient Medications  Medication Sig Dispense Refill   Azelastine HCl 137 MCG/SPRAY SOLN TAKE 2 APPLICATOR (INTRANASAL) 2 TIMES PER DAY     clobetasol ointment (TEMOVATE) 0.05 % APPLY A THIN LAYER TO THE AFFECTED AREA(S) BY TOPICAL ROUTE 2 TIMES PER DAY AS DIRECTED     doxycycline (PERIOSTAT) 20 MG tablet Take 20 mg by mouth daily.     esomeprazole (NEXIUM) 40 MG capsule Take 40 mg by mouth daily before breakfast.     estradiol (CLIMARA - DOSED IN MG/24 HR) 0.0375 mg/24hr patch APPLY 1 PATCH BY TRANSDERMAL ROUTE EVERY WEEK     fluticasone (FLONASE) 50 MCG/ACT nasal spray  hydrochlorothiazide (HYDRODIURIL) 12.5 MG tablet Take 12.5 mg by mouth daily.     HYDROcodone-acetaminophen  (NORCO/VICODIN) 5-325 MG tablet      lamoTRIgine (LAMICTAL) 100 MG tablet Take 100 mg by mouth daily.     levonorgestrel (MIRENA, 52 MG,) 20 MCG/DAY IUD Take 1 device by intrauterine route.     loratadine (CLARITIN) 10 MG tablet Take 10 mg by mouth daily.     meloxicam (MOBIC) 15 MG tablet TAKE 1 TABLET (ORAL) DAILY (PAIN)     montelukast (SINGULAIR) 10 MG tablet Take 10 mg by mouth daily.     naltrexone (DEPADE) 50 MG tablet Take 50 mg by mouth  daily.     ondansetron  (ZOFRAN ) 4 MG tablet Take 4 mg by mouth every 6 (six) hours as needed. for nausea     oxyCODONE  (ROXICODONE ) 5 MG/5ML solution Take 5-10 mLs (5-10 mg total) by mouth every 4 (four) hours as needed for moderate pain or severe pain. 300 mL 0   propranolol (INDERAL) 20 MG tablet      Semaglutide , 1 MG/DOSE, (OZEMPIC , 1 MG/DOSE,) 4 MG/3ML SOPN Inject 1 mg into the skin once a week. 3 mL 0   Semaglutide -Weight Management (WEGOVY ) 1.7 MG/0.75ML SOAJ Inject 1.7 mg into the skin once a week. 3 mL 0   terbinafine (LAMISIL) 250 MG tablet      tirzepatide (ZEPBOUND) 5 MG/0.5ML Pen INJECT 5MG  UNDER THE SKIN ONCE A WEEK 30 DAYS     VAGIFEM 10 MCG TABS vaginal tablet INSERT 1 TABLET TWICE A WEEK BY VAGINAL ROUTE AT BEDTIME FOR 28 DAYS.     venlafaxine (EFFEXOR) 37.5 MG tablet Take 37.5 mg by mouth every morning.     No current facility-administered medications for this visit.   BP (!) 150/86 (BP Location: Right Arm, Cuff Size: Large)   Pulse 69   Ht 5' 8 (1.727 m)   Wt 213 lb (96.6 kg)   SpO2 95%   BMI 32.39 kg/m   PHYSICAL EXAM:  BP (!) 150/86 (BP Location: Right Arm, Cuff Size: Large)   Pulse 69   Ht 5' 8 (1.727 m)   Wt 213 lb (96.6 kg)   SpO2 95%   BMI 32.39 kg/m    Salient findings:  CN II-XII intact Bilateral EAC clear and TM intact with well pneumatized middle ear spaces Nose: Anterior rhinoscopy reveals septum significantly deviated left, bilateral inferior turbinate hypertrophy. ULC modestly weak, but modified cottle modest pos  Nasal endoscopy was indicated to better evaluate the nose and paranasal sinuses, given the patient's history and exam findings, and is detailed below. No lesions of oral cavity/oropharynx No obviously palpable neck masses/lymphadenopathy/thyromegaly No respiratory distress or stridor   PROCEDURE:  Prior to initiating any procedures, risks/benefits/alternatives were explained to the patient and verbal consent obtained. Diagnostic  Nasal Endoscopy Pre-procedure diagnosis: Concern for nasal congestion, nasal obstruction Post-procedure diagnosis: same Indication: See pre-procedure diagnosis and physical exam above Complications: None apparent EBL: 0 mL Anesthesia: Lidocaine  4% and topical decongestant was topically sprayed in each nasal cavity  Description of Procedure:  Patient was identified. A rigid 30 degree endoscope was utilized to evaluate the sinonasal cavities, mucosa, sinus ostia and turbinates and septum.  Overall, signs of mucosal inflammation are not noted.  Also noted are significantly large left septal spur.  No mucopurulence, polyps, or masses noted.   Right Middle meatus: clear Right SE Recess: clear Left MM: clear Left SE Recess: clear    Photodocumentation was obtained.  CPT CODE --  68768 - Mod 25   ASSESSMENT:  53 y.o. with:  1. Nasal septal deviation   2. Nasal congestion   3. Hypertrophy of both inferior nasal turbinates   4. Nasal obstruction   5. Nasal valve collapse   6. OSA (obstructive sleep apnea)   7. Perennial allergic rhinitis    Clearly has a structural cause - with multiple contributory factors: septal deviation, turbinate hypertrophy, mucosal inflammation from allergies and likely some component of NV collapse. Is on nasal sprays with persistent symptoms.   PLAN: We've discussed issues and options today.  We reviewed the nasal endoscopy images together.  The risks, benefits and alternatives were discussed and questions answered.   I discussed surgical options: septoplasty, bilateral turbinate reduction +/- nasal valve repair. We discussed risks of NV repair including bruising/swelling, widening of the nose potentially, extrusion of graft, cosmetic deformity, lack of improvement. We also discussed goals of septoplasty and turbinate reduction, and expectations for postoperative management. Will plan to leave splints in place, and removal was also discussed. We also discussed  nasal obstruction post-operatively until splints in place and pain management.  We discussed R/B/A including pain, infection, bleeding (~3% risk of operative visit for control), persistent symptoms, need for revision surgery, and other risks including damage to surrounding structures, septal perforation, and injury to skull base, anesthetic complications, among others.  We also discussed use of intranasal steroid post-operatively until healing occurs  Patient understands. She would like to proceed with septoplasty/ITR only, with specific discussion about possible continued symptoms. Regardless, provided her with both CPT codes to ask her insurance if she wishes to investigate.  F/u POD 5 Will need to stop Zepbound at least 1 week prior to surgery  See below regarding exact medications prescribed this encounter including dosages and route: No orders of the defined types were placed in this encounter.    Thank you for allowing me the opportunity to care for your patient. Please do not hesitate to contact me should you have any other questions.  Sincerely, [name] Blanch, MD Otolaryngologist (ENT), [facility] Phone: [phone] Fax: [phone]  MDM:  Level 4: [phone] Complexity/Problems addressed: mod - chronic problems Data complexity: mod - independent review of notes, labs, test - Morbidity: mod - decision for surgery  - Prescription Drug prescribed or managed: no  07/01/2024, 12:14 PM

## 2024-08-28 ENCOUNTER — Encounter (HOSPITAL_BASED_OUTPATIENT_CLINIC_OR_DEPARTMENT_OTHER): Payer: Self-pay | Admitting: Emergency Medicine

## 2024-08-29 ENCOUNTER — Other Ambulatory Visit: Payer: Self-pay

## 2024-08-29 ENCOUNTER — Encounter (HOSPITAL_BASED_OUTPATIENT_CLINIC_OR_DEPARTMENT_OTHER): Payer: Self-pay | Admitting: *Deleted

## 2024-09-02 ENCOUNTER — Encounter (HOSPITAL_BASED_OUTPATIENT_CLINIC_OR_DEPARTMENT_OTHER)
Admission: RE | Admit: 2024-09-02 | Discharge: 2024-09-02 | Disposition: A | Discharge status: Home / Self Care | Source: Ambulatory Visit | Attending: Otolaryngology | Admitting: Otolaryngology

## 2024-09-02 DIAGNOSIS — Z01818 Encounter for other preprocedural examination: Secondary | ICD-10-CM | POA: Insufficient documentation

## 2024-09-02 DIAGNOSIS — Z79899 Other long term (current) drug therapy: Secondary | ICD-10-CM | POA: Insufficient documentation

## 2024-09-02 DIAGNOSIS — Z01812 Encounter for preprocedural laboratory examination: Secondary | ICD-10-CM | POA: Diagnosis present

## 2024-09-02 DIAGNOSIS — Z01810 Encounter for preprocedural cardiovascular examination: Secondary | ICD-10-CM | POA: Diagnosis present

## 2024-09-02 LAB — BASIC METABOLIC PANEL WITH GFR
Anion gap: 10 (ref 5–15)
BUN: 9 mg/dL (ref 6–20)
CO2: 27 mmol/L (ref 22–32)
Calcium: 8.5 mg/dL — ABNORMAL LOW (ref 8.9–10.3)
Chloride: 103 mmol/L (ref 98–111)
Creatinine, Ser: 0.71 mg/dL (ref 0.44–1.00)
GFR, Estimated: >60 mL/min (ref >60)
Glucose, Bld: 80 mg/dL (ref 70–99)
Potassium: 3.9 mmol/L (ref 3.5–5.1)
Sodium: 140 mmol/L (ref 135–145)

## 2024-09-03 ENCOUNTER — Other Ambulatory Visit (HOSPITAL_COMMUNITY): Payer: Self-pay

## 2024-09-03 MED ORDER — AMPHETAMINE-DEXTROAMPHET ER 20 MG PO CP24
20.0000 mg | ORAL_CAPSULE | Freq: Every day | ORAL | 0 refills | Status: AC
  Filled 2024-09-03 (×2): qty 30, 30d supply, fill #0

## 2024-09-04 ENCOUNTER — Other Ambulatory Visit: Payer: Self-pay

## 2024-09-04 ENCOUNTER — Encounter (HOSPITAL_BASED_OUTPATIENT_CLINIC_OR_DEPARTMENT_OTHER)
Admission: RE | Disposition: A | Discharge status: Home / Self Care | Payer: Self-pay | Source: Home / Self Care | Attending: Otolaryngology

## 2024-09-04 ENCOUNTER — Ambulatory Visit (HOSPITAL_BASED_OUTPATIENT_CLINIC_OR_DEPARTMENT_OTHER): Admitting: Anesthesiology

## 2024-09-04 ENCOUNTER — Ambulatory Visit (HOSPITAL_BASED_OUTPATIENT_CLINIC_OR_DEPARTMENT_OTHER)
Admission: RE | Admit: 2024-09-04 | Discharge: 2024-09-04 | Disposition: A | Discharge status: Home / Self Care | Attending: Otolaryngology | Admitting: Otolaryngology

## 2024-09-04 ENCOUNTER — Encounter (HOSPITAL_BASED_OUTPATIENT_CLINIC_OR_DEPARTMENT_OTHER): Payer: Self-pay

## 2024-09-04 DIAGNOSIS — I10 Essential (primary) hypertension: Secondary | ICD-10-CM

## 2024-09-04 DIAGNOSIS — F419 Anxiety disorder, unspecified: Secondary | ICD-10-CM | POA: Diagnosis not present

## 2024-09-04 DIAGNOSIS — E66813 Obesity, class 3: Secondary | ICD-10-CM | POA: Insufficient documentation

## 2024-09-04 DIAGNOSIS — J342 Deviated nasal septum: Secondary | ICD-10-CM

## 2024-09-04 DIAGNOSIS — F418 Other specified anxiety disorders: Secondary | ICD-10-CM | POA: Diagnosis not present

## 2024-09-04 DIAGNOSIS — G4733 Obstructive sleep apnea (adult) (pediatric): Secondary | ICD-10-CM | POA: Insufficient documentation

## 2024-09-04 DIAGNOSIS — J3489 Other specified disorders of nose and nasal sinuses: Secondary | ICD-10-CM | POA: Insufficient documentation

## 2024-09-04 DIAGNOSIS — Z6837 Body mass index (BMI) 37.0-37.9, adult: Secondary | ICD-10-CM | POA: Insufficient documentation

## 2024-09-04 DIAGNOSIS — J343 Hypertrophy of nasal turbinates: Secondary | ICD-10-CM | POA: Insufficient documentation

## 2024-09-04 DIAGNOSIS — F32A Depression, unspecified: Secondary | ICD-10-CM | POA: Insufficient documentation

## 2024-09-04 DIAGNOSIS — G4730 Sleep apnea, unspecified: Secondary | ICD-10-CM

## 2024-09-04 DIAGNOSIS — K219 Gastro-esophageal reflux disease without esophagitis: Secondary | ICD-10-CM | POA: Diagnosis not present

## 2024-09-04 DIAGNOSIS — R0981 Nasal congestion: Secondary | ICD-10-CM

## 2024-09-04 DIAGNOSIS — Z01818 Encounter for other preprocedural examination: Secondary | ICD-10-CM

## 2024-09-04 DIAGNOSIS — Z79899 Other long term (current) drug therapy: Secondary | ICD-10-CM

## 2024-09-04 HISTORY — DX: Essential (primary) hypertension: I10

## 2024-09-04 HISTORY — PX: NASAL SEPTOPLASTY W/ TURBINOPLASTY: SHX2070

## 2024-09-04 HISTORY — DX: Obstructive sleep apnea (adult) (pediatric): G47.33

## 2024-09-04 LAB — POCT PREGNANCY, URINE: Preg Test, Ur: NEGATIVE

## 2024-09-04 SURGERY — SEPTOPLASTY, NOSE, WITH NASAL TURBINATE REDUCTION
Anesthesia: General | Laterality: Bilateral

## 2024-09-04 MED ORDER — ACETAMINOPHEN 500 MG PO TABS
1000.0000 mg | ORAL_TABLET | Freq: Once | ORAL | Status: AC
  Administered 2024-09-04: 1000 mg via ORAL

## 2024-09-04 MED ORDER — FENTANYL CITRATE (PF) 100 MCG/2ML IJ SOLN
INTRAMUSCULAR | Status: AC
  Filled 2024-09-04: qty 2

## 2024-09-04 MED ORDER — FENTANYL CITRATE (PF) 100 MCG/2ML IJ SOLN
INTRAMUSCULAR | Status: DC | PRN
  Administered 2024-09-04 (×3): 50 ug via INTRAVENOUS

## 2024-09-04 MED ORDER — LIDOCAINE HCL (CARDIAC) PF 100 MG/5ML IV SOSY
PREFILLED_SYRINGE | INTRAVENOUS | Status: DC | PRN
  Administered 2024-09-04: 100 mg via INTRAVENOUS

## 2024-09-04 MED ORDER — OXYCODONE HCL 5 MG PO TABS
5.0000 mg | ORAL_TABLET | Freq: Once | ORAL | Status: AC
Start: 2024-09-04 — End: 2024-09-04
  Administered 2024-09-04: 5 mg via ORAL

## 2024-09-04 MED ORDER — MIDAZOLAM HCL 5 MG/5ML IJ SOLN
INTRAMUSCULAR | Status: DC | PRN
  Administered 2024-09-04: 2 mg via INTRAVENOUS

## 2024-09-04 MED ORDER — PHENYLEPHRINE 80 MCG/ML (10ML) SYRINGE FOR IV PUSH (FOR BLOOD PRESSURE SUPPORT)
PREFILLED_SYRINGE | INTRAVENOUS | Status: AC
Start: 2024-09-04 — End: 2024-09-04
  Filled 2024-09-04: qty 10

## 2024-09-04 MED ORDER — SALINE SPRAY 0.65 % NA SOLN: 2.0000 | NASAL | 0 refills | Status: AC | PRN

## 2024-09-04 MED ORDER — MIDAZOLAM HCL 2 MG/2ML IJ SOLN
INTRAMUSCULAR | Status: AC
  Filled 2024-09-04: qty 2

## 2024-09-04 MED ORDER — GLYCOPYRROLATE PF 0.2 MG/ML IJ SOSY
PREFILLED_SYRINGE | INTRAMUSCULAR | Status: AC
  Filled 2024-09-04: qty 1

## 2024-09-04 MED ORDER — FLUORESCEIN SODIUM 1 MG OP STRP
ORAL_STRIP | OPHTHALMIC | Status: DC | PRN
  Administered 2024-09-04: 1

## 2024-09-04 MED ORDER — CEFAZOLIN SODIUM-DEXTROSE 2-3 GM-%(50ML) IV SOLR
INTRAVENOUS | Status: DC | PRN
  Administered 2024-09-04: 2 g via INTRAVENOUS

## 2024-09-04 MED ORDER — LIDOCAINE-EPINEPHRINE 1 %-1:100000 IJ SOLN
INTRAMUSCULAR | Status: AC
  Filled 2024-09-04: qty 1

## 2024-09-04 MED ORDER — EPINEPHRINE HCL (NASAL) 0.1 % NA SOLN
NASAL | Status: AC
  Filled 2024-09-04: qty 10

## 2024-09-04 MED ORDER — PROPOFOL 10 MG/ML IV BOLUS
INTRAVENOUS | Status: DC | PRN
  Administered 2024-09-04: 50 mg via INTRAVENOUS
  Administered 2024-09-04: 200 mg via INTRAVENOUS

## 2024-09-04 MED ORDER — FLUORESCEIN SODIUM 1 MG OP STRP
ORAL_STRIP | OPHTHALMIC | Status: AC
  Filled 2024-09-04: qty 1

## 2024-09-04 MED ORDER — PROPOFOL 500 MG/50ML IV EMUL
INTRAVENOUS | Status: DC | PRN
  Administered 2024-09-04: 100 ug/kg/min via INTRAVENOUS

## 2024-09-04 MED ORDER — ONDANSETRON HCL 4 MG/2ML IJ SOLN
INTRAMUSCULAR | Status: DC | PRN
Start: 2024-09-04 — End: 2024-09-04
  Administered 2024-09-04: 4 mg via INTRAVENOUS

## 2024-09-04 MED ORDER — HYDROMORPHONE HCL 1 MG/ML IJ SOLN
0.2500 mg | INTRAMUSCULAR | Status: DC | PRN
  Administered 2024-09-04: 0.5 mg via INTRAVENOUS

## 2024-09-04 MED ORDER — ACETAMINOPHEN 500 MG PO TABS: 1000.0000 mg | ORAL_TABLET | Freq: Four times a day (QID) | ORAL | 2 refills | Status: AC | PRN

## 2024-09-04 MED ORDER — HYDROMORPHONE HCL 1 MG/ML IJ SOLN
INTRAMUSCULAR | Status: AC
  Filled 2024-09-04: qty 0.5

## 2024-09-04 MED ORDER — CEFAZOLIN SODIUM 1 G IJ SOLR
INTRAMUSCULAR | Status: AC
Start: 2024-09-04 — End: 2024-09-04
  Filled 2024-09-04: qty 10

## 2024-09-04 MED ORDER — GLYCOPYRROLATE 0.2 MG/ML IJ SOLN
INTRAMUSCULAR | Status: DC | PRN
  Administered 2024-09-04: .2 mg via INTRAVENOUS

## 2024-09-04 MED ORDER — IBUPROFEN 200 MG PO TABS
400.0000 mg | ORAL_TABLET | Freq: Four times a day (QID) | ORAL | 2 refills | Status: AC | PRN
Start: 2024-09-04 — End: 2025-09-04

## 2024-09-04 MED ORDER — SODIUM CHLORIDE 0.9 % IV SOLN
INTRAVENOUS | Status: AC | PRN
  Administered 2024-09-04: 1000 mL

## 2024-09-04 MED ORDER — LIDOCAINE-EPINEPHRINE 1 %-1:100000 IJ SOLN
INTRAMUSCULAR | Status: DC | PRN
Start: 2024-09-04 — End: 2024-09-04
  Administered 2024-09-04: 18 mL

## 2024-09-04 MED ORDER — LACTATED RINGERS IV SOLN: INTRAVENOUS | Status: DC

## 2024-09-04 MED ORDER — ACETAMINOPHEN 500 MG PO TABS
ORAL_TABLET | ORAL | Status: AC
  Filled 2024-09-04: qty 2

## 2024-09-04 MED ORDER — EPINEPHRINE 0.1 % (1MG/ML) IJ FOR NASAL USE
INTRAMUSCULAR | Status: DC | PRN
  Administered 2024-09-04: 1 [drp] via TOPICAL

## 2024-09-04 MED ORDER — SUGAMMADEX SODIUM 200 MG/2ML IV SOLN
INTRAVENOUS | Status: DC | PRN
  Administered 2024-09-04: 200 mg via INTRAVENOUS

## 2024-09-04 MED ORDER — DEXAMETHASONE SODIUM PHOSPHATE 4 MG/ML IJ SOLN
INTRAMUSCULAR | Status: DC | PRN
  Administered 2024-09-04: 10 mg via INTRAVENOUS

## 2024-09-04 MED ORDER — ROCURONIUM BROMIDE 100 MG/10ML IV SOLN
INTRAVENOUS | Status: DC | PRN
  Administered 2024-09-04: 10 mg via INTRAVENOUS
  Administered 2024-09-04: 50 mg via INTRAVENOUS
  Administered 2024-09-04: 10 mg via INTRAVENOUS

## 2024-09-04 MED ORDER — 0.9 % SODIUM CHLORIDE (POUR BTL) OPTIME
TOPICAL | Status: DC | PRN
  Administered 2024-09-04: 1000 mL

## 2024-09-04 MED ORDER — OXYCODONE HCL 5 MG PO TABS: 5.0000 mg | ORAL_TABLET | ORAL | 0 refills | Status: AC | PRN

## 2024-09-04 MED ORDER — PROPOFOL 500 MG/50ML IV EMUL
INTRAVENOUS | Status: AC
Start: 2024-09-04 — End: 2024-09-04
  Filled 2024-09-04: qty 50

## 2024-09-04 MED ORDER — PHENYLEPHRINE HCL (PRESSORS) 10 MG/ML IV SOLN
INTRAVENOUS | Status: DC | PRN
  Administered 2024-09-04 (×2): 80 ug via INTRAVENOUS

## 2024-09-04 MED ORDER — OXYCODONE HCL 5 MG PO TABS
ORAL_TABLET | ORAL | Status: AC
  Filled 2024-09-04: qty 1

## 2024-09-04 MED ORDER — EPINEPHRINE 0.1 % (1MG/ML) IJ FOR NASAL USE: INTRAMUSCULAR | Status: DC | PRN

## 2024-09-04 MED ORDER — PROPOFOL 10 MG/ML IV BOLUS
INTRAVENOUS | Status: AC
  Filled 2024-09-04: qty 20

## 2024-09-04 MED ORDER — CEFAZOLIN SODIUM 1 G IJ SOLR
INTRAMUSCULAR | Status: AC
  Filled 2024-09-04: qty 10

## 2024-09-04 MED ORDER — PHENYLEPHRINE 80 MCG/ML (10ML) SYRINGE FOR IV PUSH (FOR BLOOD PRESSURE SUPPORT)
PREFILLED_SYRINGE | INTRAVENOUS | Status: AC
  Filled 2024-09-04: qty 10

## 2024-09-04 MED ORDER — MUPIROCIN 2 % EX OINT
TOPICAL_OINTMENT | CUTANEOUS | Status: AC
  Filled 2024-09-04: qty 22

## 2024-09-04 SURGICAL SUPPLY — 40 items
BLADE INF TURB ROT M4 2 5PK (BLADE) IMPLANT
BLADE SHAVER TURBINATE 11X2.9 (BLADE) ×1 IMPLANT
BLADE TRICUT ROTATE M4 4 5PK (BLADE) IMPLANT
CANISTER SUCT 1200ML W/VALVE (MISCELLANEOUS) ×2 IMPLANT
COAGULATOR SUCT 8FR VV (MISCELLANEOUS) IMPLANT
DEFOGGER MIRROR 1QT (MISCELLANEOUS) ×1 IMPLANT
DRSG NASOPORE 8CM (GAUZE/BANDAGES/DRESSINGS) IMPLANT
DRSG TELFA 3X8 NADH STRL (GAUZE/BANDAGES/DRESSINGS) IMPLANT
ELECTRODE REM PT RTRN 9FT ADLT (ELECTROSURGICAL) ×1 IMPLANT
GAUZE SPONGE 2X2 STRL 8-PLY (GAUZE/BANDAGES/DRESSINGS) ×1 IMPLANT
GLOVE BIO SURGEON STRL SZ 6.5 (GLOVE) ×1 IMPLANT
GOWN STRL REUS W/ TWL LRG LVL3 (GOWN DISPOSABLE) ×2 IMPLANT
HEMOSTAT SURGICEL .5X2 ABSORB (HEMOSTASIS) IMPLANT
HEMOSTAT SURGICEL 2X14 (HEMOSTASIS) IMPLANT
IV NS 500ML BAXH (IV SOLUTION) ×1 IMPLANT
NDL HYPO 25X1 1.5 SAFETY (NEEDLE) ×2 IMPLANT
NDL SPNL 25GX3.5 QUINCKE BL (NEEDLE) IMPLANT
NEEDLE HYPO 25X1 1.5 SAFETY (NEEDLE) ×2 IMPLANT
NEEDLE SPNL 25GX3.5 QUINCKE BL (NEEDLE) IMPLANT
PACK BASIN DAY SURGERY FS (CUSTOM PROCEDURE TRAY) ×1 IMPLANT
PACK ENT DAY SURGERY (CUSTOM PROCEDURE TRAY) ×1 IMPLANT
PATTIES SURGICAL .5 X3 (DISPOSABLE) ×1 IMPLANT
SHEATH ENDOSCRUB 0 DEG (SHEATH) IMPLANT
SHEATH ENDOSCRUB 30 DEG (SHEATH) IMPLANT
SHEET SILICONE 2X3 0.03 REINF (MISCELLANEOUS) IMPLANT
SLEEVE SCD COMPRESS KNEE MED (STOCKING) ×1 IMPLANT
SOLN 0.9% NACL POUR BTL 1000ML (IV SOLUTION) ×1 IMPLANT
SOLUTION ANTFG W/FOAM PAD STRL (MISCELLANEOUS) ×1 IMPLANT
SPIKE FLUID TRANSFER (MISCELLANEOUS) IMPLANT
SPLINT NASAL AIRWAY SILICONE (MISCELLANEOUS) ×1 IMPLANT
SPLINT NASAL POSISEP X .6X2 (GAUZE/BANDAGES/DRESSINGS) IMPLANT
SUCTION TUBE FRAZIER 10FR DISP (SUCTIONS) ×1 IMPLANT
SUT CHROMIC 4 0 RB 1X27 (SUTURE) IMPLANT
SUT PROLENE 3 0 PS 2 (SUTURE) ×1 IMPLANT
SUT VICRYL RAPIDE 4-0 (SUTURE) IMPLANT
SUT VICRYL RAPIDE 4/0 PS 2 (SUTURE) ×1 IMPLANT
SYR 10ML LL (SYRINGE) ×1 IMPLANT
TOWEL GREEN STERILE FF (TOWEL DISPOSABLE) ×1 IMPLANT
TUBE CONNECTING 20X1/4 (TUBING) ×1 IMPLANT
YANKAUER SUCT BULB TIP NO VENT (SUCTIONS) ×1 IMPLANT

## 2024-09-04 NOTE — H&P (Signed)
 Pre-Operative H&P - Day Of Surgery Patient Name: Sylvia Watkins Date:   09/04/2024  HPI: Sylvia Watkins is a 53 y.o. female who presents today for operative treatment of nasal obstruction, nasal septal deviation, bilateral inferior turbinate hypertrophy. Patient denies recent significant changes to health or significant new medications or physiologic change in condition which would immediately impact plans. No new types of therapy has been initiated that would change the plan or the appropriateness of the plan.   ROS:  A complete review of systems was obtained and is otherwise negative.   PMH:  Past Medical History:  Diagnosis Date   Anxiety    Depression    GERD (gastroesophageal reflux disease)    HTN (hypertension)    OSA (obstructive sleep apnea)     PSH:  Past Surgical History:  Procedure Laterality Date   BREAST BIOPSY Left 2013   TEE WITHOUT CARDIOVERSION  08/21/2012   Procedure: TRANSESOPHAGEAL ECHOCARDIOGRAM (TEE);  Surgeon: Erick JONELLE Bergamo, MD;  Location: [facility] ENDOSCOPY;  Service: Cardiovascular;  Laterality: N/A;   TONSILLECTOMY N/A 03/10/2014   Procedure: TONSILLECTOMY;  Surgeon: Ana LELON Moccasin, MD;  Location: [facility];  Service: ENT;  Laterality: N/A;   UPPER GI ENDOSCOPY     WISDOM TOOTH EXTRACTION      MEDS:   Current Facility-Administered Medications:    lactated ringers  infusion, , Intravenous, Continuous, Cleotilde Butler Dade, MD  ALLERGIES: Patient has no known allergies.  EXAM: Vitals: BP (!) 155/75   Pulse 62   Temp 98.4 F (36.9 C) (Temporal)   Resp 16   Ht 5' 8 (1.727 m)   Wt 112.2 kg   SpO2 100%   BMI 37.61 kg/m   General Awake, at baseline alertness.   HEENT No scleral icterus or conjunctival hemorrhage. Globe position appears normal. External ears  normal. Nose patent without rhinorrhea. No lymphadenopathy. No thyromegaly  Cardiovascular No cyanosis.  Pulmonary No audible stridor. Breathing easily with no labor.  Neuro Symmetric  facial movement.   Psychiatry Appropriate affect and mood.  Skin No scars or lesions on face or neck.  Extermities Moves all extremities with normal range of motion.   Other Findings None.   Assessment & Plan: Sylvia Watkins has diagnoses of nasal obstruction, nasal septal deviation, bilateral inferior turbinate hypertrophy and will go to the OR today for septoplasty, bilateral inferior turbinate reduction. Informed consent was obtained and available in EMR today. All questions have been answered, and risks/benefits/alternatives of procedure as noted in the consent were discussed in a quiet area. Questions were invited and answered. The patient expressed understanding, provided consent and wished to proceed despite risks.  Sylvia Watkins [name] 09/04/2024 12:11 PM

## 2024-09-04 NOTE — Op Note (Addendum)
 Otolaryngology Operative note  Sylvia Watkins Date/Time of Admission: 09/04/2024 10:52 AM  CSN: [id number];MRN:[id number]  DOB: June 20, 1971 Age: 53 y.o. Location: [facility]    Pre-Op Diagnosis: Deviated nasal septum Hypertrophy of bilateral nasal turbinates Nasal Obstruction Nasal Congestion  Post-Op Diagnosis: Same  Procedure: Endoscopic-assisted Septoplasty (CPT 30520) Bilateral Nasal Endoscopy with Submucous Resection of Bilateral Inferior Turbinates (CPT 30140-50, 51)  Surgeon: [name] Blanch, MD  Anesthesia type:  General  Anesthesiologist: Anesthesiologist: Darlyn Rush, MD CRNA: Pam Macario [name], CRNA; Sylvia Berwyn SQUIBB, CRNA   Staff: See Kathrin  Implants: Emerick Splints - Bilateral  Specimens: None  EBL:  50 mL  Drains: None  Post-op disposition and condition: PACU, hemodynamically stable   Findings: Significantly deviated left septal deviation with evidence of prior fracture with telescoping. Large septal spur contacting and scarred to left inferior turbinate. Nasal airway more patent at end of procedure  Complications: None apparent  Indications and consent:  Sylvia Watkins is a 53 y.o. female with diagnoses above with persistent symptoms despite medical management. The patient's options were discussed, including risks/benefits/alternatives for each option. Patient expressed understanding, and despite these risks, consented and decided to proceed with above procedures. Informed consent was signed before proceeding.  Procedure: Anesthesia and Prep -- After being properly identified in the preoperative holding area, the patient was brought into the operating suite. She was placed supine on the operating table. A pre-procedural time-out was performed. Pre-operative antibiotics and steroids were administered. After induction of general anesthesia, the patient was successfully intubated with confirmation of tube placement via CO2 return. Eyes  were taped closed. Pledgets soaked in 1:1000 fluorescein dyed epinephrine were placed in each nostril. The face was prepped and draped in usual fashion for sinus surgery.  Endoscopic-assisted septoplasty  -- The nose was decongested with epinephrine 1:1000 soaked pledgets. The septum was injected bilaterally with 1% lidocaine  with 1:100,000 epinephrine. A 15 blade was utilized to make a hemi-transfixion incision in the left nasal cavity. A Cottle elevator was then used to elevate a sub-mucoperichondrial flap. Once adequate room was available, the 0 degree endoscopic was placed into the flap and the septal cartilage was visualized. The suction freer was used to raise the flap back to the bony-cartilaginous junction. A caudal transcartilaginous cut was made using the Cottle elevator taking care to leave at least a 1cm caudal strut. The opposing mucoperichondrial flap was then elevated off the cartilage and bone of the right side. Then, a Rudean forcep was used to cut away the cartilage working anterior to posterior. As the cartilage was removed superiorly, care was taken to leave at least a 1cm dorsal strut.  A Jansen-Middleton double-action rongeur was used to remove the bony aspect of the deviated septum.  During elevation, there was a tear in the left mucoperichondrial flap overlying the previous leftward spur.  There was no opposing defect created.  Bony maxillary crest was identified and partially removed with a Rudean forcep. The nose was then inspected and the septum was seen to be midline. The previous nasal obstruction was much improved. At the conclusion of the case, the hemitransfixion incision was closed with interrupted 4-0 vicryl rapide stuure suture and doyle splints coated in mupirocin were placed bilaterally and sutured to the anterior septum with a 3-0 prolene suture.   Bilateral nasal endoscopy with bilateral submucous inferior turbinate reduction with microdebrider and outfracture -- 1%  lidocaine  with 1:100,000 units of epinephrine was injected into the bilateral inferior turbinates. A 15 blade was used  to make an incision in the head of the left inferior turbinate. A cottle elevator was then used to elevate a submucosal flap along the medial surface of the turbinate. A microdebrider turbinate blade was inserted into the left turbinate and pieces of bone and soft tissue of the turbinate were resected in a submucosal fashion. Particular attention was paid to the head of the turbinate to improve nasal valve airflow. Once adequate resection was achieved, a Engineering geologist was used to out-fracture the inferior turbinate. This procedure was then repeated on the right side to complete the bilateral submucosal inferior turbinate resection. The nasal airway was then seen to be widely patent.  Conclusion -- Both sides were inspected and no orbital fat or evidence of cerebral spinal fluid leak were observed. Both sides were irrigated and clot remove. A flexible suction catheter was used to remove fluid and blood from the oropharynx and hypopharynx. Tape was removed from the eyelids and the eyes were checked for tension or proptosis, none was observed. The patient's skin was cleaned. She was returned to the care of the anesthesia team. She was then weaned from the anesthetic and transported to the PACU in stable condition.  Sylvia Watkins

## 2024-09-04 NOTE — Anesthesia Postprocedure Evaluation (Signed)
 Anesthesia Post Note  Patient: Sylvia Watkins  Procedure(s) Performed: SEPTOPLASTY, NOSE, WITH NASAL TURBINATE REDUCTION (Bilateral)     Patient location during evaluation: PACU Anesthesia Type: General Level of consciousness: sedated and patient cooperative Pain management: pain level controlled Vital Signs Assessment: post-procedure vital signs reviewed and stable Respiratory status: spontaneous breathing Cardiovascular status: stable Anesthetic complications: no   No notable events documented.  Last Vitals:  Vitals:   09/04/24 1541 09/04/24 1621  BP: 134/77 139/85  Pulse: 79 66  Resp: 12 12  Temp:  36.8 C  SpO2: 94% 93%    Last Pain:  Vitals:   09/04/24 1615  TempSrc:   PainSc: 3                  Norleen Pope

## 2024-09-04 NOTE — Discharge Instructions (Addendum)
 Surgery Discharge Instructions (Dr. Tobie)  1. Call your doctor or go to the emergency room if you have: - Fever of 101.5 degrees or higher - Severe pain that has increased greatly since your surgery or is uncontrolled by your current pain medications - Increasing or concerning amount of bleeding from your nose. You should expect some bloody drainage from your nose for 1-4 days. Even a 5-10 minute trickle nose bleed is expected after surgery - Nausea and vomiting that does not go away - Chest pain/shortness of breath - Any other acute events, problems, or concerns  2. Wound Care/Dressings/Drain Instructions:  - It is OK to shower following your surgery. - You should begin using nasal saline irrigations once you no longer have bleeding from the nose. This is usually 1-2 days after your surgery. -There are small soft plastic splints in your nose to hold your septum in the middle. These may crust a bit, which can be reduced by using nasal saline spray.    3. Follow Up:  - A follow up appointment will be scheduled for you with Dr. Tobie. If you do not know the date/time, please contact our office  4. Activity/Restrictions: - Resume your regular activities, as tolerated - Avoid heavy lifting, bending over, manipulating your nose, blowing your nose, sneezing with your mouth closed, straining and strenuous activities until instructed otherwise. Do not lift more than 10 lbs for 10 days  5. Diet: - Resume your regular diet, as tolerated  6. Additional Instructions: - You should complete the course of antibiotic/steroid prescribed to you before surgery - Use acetaminophen /Tylenol  and ibuprofen to control your pain. If this does not bring you relief or the pain remains severe, a stronger pain medication (Oxycodone  - 5mg  tablet every 4-6 hours as needed) has been prescribed to you. - DO NOT MIX NARCOTIC PAIN MEDICATIONS OR TAKE NARCOTIC PRESCRIPTIONS AT THE SAME TIME (PERCODET, LORTAB, ROXICODONE ,  ETC.) - DO NOT DRIVE OR OPERATE HEAVY MACHINERY WHILE ON NARCOTICS - DO NOT TAKE MORE THAN 4 GRAMS (4000mg ) OF TYLENOL  (ACETAMINOPHEN ) IN 24 HOURS  ________________________________________________________________  Department of Otolaryngology Contact Info: Otolaryngology Nursing Triage (Monday-Friday daytime working hours or for emergencies after hours) [phone]     Post Anesthesia Home Care Instructions  Activity: Get plenty of rest for the remainder of the day. A responsible individual must stay with you for 24 hours following the procedure.  For the next 24 hours, DO NOT: -Drive a car -Advertising copywriter -Drink alcoholic beverages -Take any medication unless instructed by your physician -Make any legal decisions or sign important papers.  Meals: Start with liquid foods such as gelatin or soup. Progress to regular foods as tolerated. Avoid greasy, spicy, heavy foods. If nausea and/or vomiting occur, drink only clear liquids until the nausea and/or vomiting subsides. Call your physician if vomiting continues.  Special Instructions/Symptoms: Your throat may feel dry or sore from the anesthesia or the breathing tube placed in your throat during surgery. If this causes discomfort, gargle with warm salt water. The discomfort should disappear within 24 hours.  If you had a scopolamine patch placed behind your ear for the management of post- operative nausea and/or vomiting:  1. The medication in the patch is effective for 72 hours, after which it should be removed.  Wrap patch in a tissue and discard in the trash. Wash hands thoroughly with soap and water. 2. You may remove the patch earlier than 72 hours if you experience unpleasant side effects which may include  dry mouth, dizziness or visual disturbances. 3. Avoid touching the patch. Wash your hands with soap and water after contact with the patch.      Next dose of Tylenol  may be given at 6:00pm if needed.  Oxycodone  given  at 4:20pm

## 2024-09-04 NOTE — Anesthesia Procedure Notes (Signed)
 Procedure Name: Intubation Date/Time: 09/04/2024 12:59 PM  Performed by: Pam Macario BROCKS, CRNAPre-anesthesia Checklist: Patient identified, Emergency Drugs available, Suction available, Patient being monitored and Timeout performed Patient Re-evaluated:Patient Re-evaluated prior to induction Oxygen Delivery Method: Circle system utilized Preoxygenation: Pre-oxygenation with 100% oxygen Induction Type: IV induction Ventilation: Mask ventilation without difficulty Laryngoscope Size: Mac and 4 Grade View: Grade II Tube type: Oral Tube size: 7.0 mm Number of attempts: 1 Airway Equipment and Method: Stylet, Oral airway and Video-laryngoscopy Placement Confirmation: ETT inserted through vocal cords under direct vision, positive ETCO2, breath sounds checked- equal and bilateral and CO2 detector Secured at: 21 cm Tube secured with: Tape Dental Injury: Teeth and Oropharynx as per pre-operative assessment

## 2024-09-04 NOTE — Transfer of Care (Signed)
 Immediate Anesthesia Transfer of Care Note  Patient: Sylvia Watkins  Procedure(s) Performed: SEPTOPLASTY, NOSE, WITH NASAL TURBINATE REDUCTION (Bilateral)  Patient Location: PACU  Anesthesia Type:General  Level of Consciousness: awake and patient cooperative  Airway & Oxygen Therapy: Patient Spontanous Breathing and Patient connected to face mask oxygen  Post-op Assessment: Report given to RN and Post -op Vital signs reviewed and stable  Post vital signs: Reviewed and stable  Last Vitals:  Vitals Value Taken Time  BP 132/84 09/04/24 15:03  Temp 36.8 C 09/04/24 15:03  Pulse 86 09/04/24 15:07  Resp 14 09/04/24 15:07  SpO2 98 % 09/04/24 15:07  Vitals shown include unfiled device data.  Last Pain:  Vitals:   09/04/24 1503  TempSrc:   PainSc: 0-No pain         Complications: No notable events documented.

## 2024-09-04 NOTE — Anesthesia Preprocedure Evaluation (Addendum)
 Anesthesia Evaluation  Patient identified by MRN, date of birth, ID band Patient awake    Reviewed: Allergy & Precautions, H&P , NPO status , Patient's Chart, lab work & pertinent test results  Airway Mallampati: III  TM Distance: >3 FB Neck ROM: Full    Dental no notable dental hx. (+) Dental Advisory Given, Teeth Intact   Pulmonary sleep apnea , neg recent URI   Pulmonary exam normal breath sounds clear to auscultation       Cardiovascular hypertension, Normal cardiovascular exam Rhythm:Regular Rate:Normal     Neuro/Psych  PSYCHIATRIC DISORDERS Anxiety Depression    negative neurological ROS     GI/Hepatic Neg liver ROS,GERD  Medicated and Controlled,,  Endo/Other    Class 3 obesity  Renal/GU negative Renal ROS     Musculoskeletal   Abdominal  (+) + obese  Peds  Hematology negative hematology ROS (+)   Anesthesia Other Findings   Reproductive/Obstetrics negative OB ROS                              Anesthesia Physical Anesthesia Plan  ASA: 3  Anesthesia Plan: General   Post-op Pain Management: Tylenol  PO (pre-op)*   Induction: Intravenous  PONV Risk Score and Plan: 4 or greater and Ondansetron , Dexamethasone  and Midazolam   Airway Management Planned: Oral ETT and Video Laryngoscope Planned  Additional Equipment:   Intra-op Plan:   Post-operative Plan: Extubation in OR  Informed Consent: I have reviewed the patients History and Physical, chart, labs and discussed the procedure including the risks, benefits and alternatives for the proposed anesthesia with the patient or authorized representative who has indicated his/her understanding and acceptance.     Dental advisory given  Plan Discussed with: CRNA  Anesthesia Plan Comments:          Anesthesia Quick Evaluation

## 2024-09-05 ENCOUNTER — Encounter (HOSPITAL_BASED_OUTPATIENT_CLINIC_OR_DEPARTMENT_OTHER): Payer: Self-pay | Admitting: Otolaryngology

## 2024-09-09 ENCOUNTER — Encounter (INDEPENDENT_AMBULATORY_CARE_PROVIDER_SITE_OTHER): Payer: Self-pay | Admitting: Otolaryngology

## 2024-09-09 ENCOUNTER — Ambulatory Visit (INDEPENDENT_AMBULATORY_CARE_PROVIDER_SITE_OTHER): Admitting: Otolaryngology

## 2024-09-09 VITALS — BP 141/72 | HR 73 | Ht 68.0 in | Wt 247.0 lb

## 2024-09-09 DIAGNOSIS — R0981 Nasal congestion: Secondary | ICD-10-CM

## 2024-09-09 DIAGNOSIS — Z9889 Other specified postprocedural states: Secondary | ICD-10-CM

## 2024-09-09 DIAGNOSIS — J3489 Other specified disorders of nose and nasal sinuses: Secondary | ICD-10-CM

## 2024-09-09 DIAGNOSIS — J342 Deviated nasal septum: Secondary | ICD-10-CM

## 2024-09-09 DIAGNOSIS — J34829 Nasal valve collapse, unspecified: Secondary | ICD-10-CM

## 2024-09-09 DIAGNOSIS — J343 Hypertrophy of nasal turbinates: Secondary | ICD-10-CM

## 2024-09-09 NOTE — Progress Notes (Signed)
 S/p Setoplasty/turbinate reduction on 09/04/2024 S: Doing well, nasal breathing is much improved after split removal. No pain, no fevers, no bleeding.  O: Doyle splints in place, removed; after removal, no septal hematoma noted; Able to visualize axilla of MT bilaterally; tubinates reduced; no epistaxis. No evidence ofinfection A/P: 53 y.o. 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

## 2024-09-09 NOTE — Patient Instructions (Signed)
  Use two sprays of flonase in each nostril twice per day  Use ocean spray or saline spray 4-5 times per day 2 sprays each nostril   [facility] Nasal Saline Rinse - use daily or up to twice per day - start nasal saline rinses with [employer]Med Bottle available over the counter    Nasal Saline Irrigation instructions: If you choose to make your own salt water solution, You will need: Salt (kosher, canning, or pickling salt) Baking soda Nasal irrigation bottle (i.e. A[facility] Med Sinus Rinse) Measuring spoon ( teaspoon) Distilled / boiled water   Mix solution Mix 1 teaspoon of salt, 1/2 teaspoon of baking soda and 1 cup of water into irrigation bottle ** May use saline packet instead of homemade recipe for this step if you prefer If medicine was prescribed to be mixed with solution, place this into bottle Examples 2 inches of 2% mupirocin ointment Budesonide solution Position your head: Lean over sink (about 45 degrees) Rotate head (about 45 degrees) so that one nostril is above the other Irrigate Insert tip of irrigation bottle into upper nostril so it forms a comfortable seal Irrigate while breathing through your mouth May remove the straw from the bottle in order to irrigate the entire solution (important if medicine was added) Exhale through nose when finished and blow nose as necessary  Repeat on opposite side with other 1/2 of solution (120 mL) or remake solution if all 240 mL was used on first side Wash irrigation bottle regularly, replace every 3 months

## 2024-10-08 ENCOUNTER — Encounter (INDEPENDENT_AMBULATORY_CARE_PROVIDER_SITE_OTHER): Admitting: Otolaryngology

## 2024-10-21 ENCOUNTER — Ambulatory Visit (INDEPENDENT_AMBULATORY_CARE_PROVIDER_SITE_OTHER): Admitting: Otolaryngology

## 2024-10-21 ENCOUNTER — Encounter (INDEPENDENT_AMBULATORY_CARE_PROVIDER_SITE_OTHER): Payer: Self-pay | Admitting: Otolaryngology

## 2024-10-21 VITALS — BP 145/73 | HR 62 | Ht 68.0 in | Wt 247.0 lb

## 2024-10-21 DIAGNOSIS — J343 Hypertrophy of nasal turbinates: Secondary | ICD-10-CM

## 2024-10-21 DIAGNOSIS — R0981 Nasal congestion: Secondary | ICD-10-CM

## 2024-10-21 DIAGNOSIS — J342 Deviated nasal septum: Secondary | ICD-10-CM

## 2024-10-21 DIAGNOSIS — J3489 Other specified disorders of nose and nasal sinuses: Secondary | ICD-10-CM

## 2024-10-21 NOTE — Progress Notes (Signed)
 S/p Setoplasty/turbinate reduction on 09/04/2024 S: Doing well, nasal breathing is much improved overall -- right > left. No pain, no fevers, no bleeding.  O: Able to visualize axilla of MT bilaterally; tubinates reduced; no epistaxis. No evidence of infection A/P: 53 y.o. F w/ Septal deviation and Watkins/l ITH s/p septoplasty and Watkins/l ITR Doing well Continue nasal saline spray PRN F/u PRN  Sylvia Watkins Levie Owensby

## 2024-10-21 NOTE — Patient Instructions (Signed)
 [facility]urel[facility] Med Smell Retraining Kit ---- buy it over the counter
